# Patient Record
Sex: Female | Born: 2000 | Hispanic: No | Marital: Married | Smoking: Never smoker
Health system: Southern US, Community
[De-identification: ages and names within clinical notes are randomized; demographics above are authoritative.]

## PROBLEM LIST (undated history)

## (undated) DIAGNOSIS — Z789 Other specified health status: Secondary | ICD-10-CM

## (undated) HISTORY — PX: APPENDECTOMY: SHX54

---
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
## 2020-05-05 ENCOUNTER — Encounter (HOSPITAL_COMMUNITY): Payer: Self-pay

## 2020-05-05 ENCOUNTER — Encounter (HOSPITAL_COMMUNITY): Payer: Self-pay | Admitting: Family Medicine

## 2020-05-05 ENCOUNTER — Other Ambulatory Visit: Payer: Self-pay

## 2020-05-05 ENCOUNTER — Ambulatory Visit (HOSPITAL_COMMUNITY)
Admission: EM | Admit: 2020-05-05 | Discharge: 2020-05-05 | Disposition: A | Discharge status: Home / Self Care | Payer: Medicaid Other | Attending: Emergency Medicine | Admitting: Emergency Medicine

## 2020-05-05 ENCOUNTER — Inpatient Hospital Stay (HOSPITAL_COMMUNITY)
Admission: AD | Admit: 2020-05-05 | Discharge: 2020-05-05 | Disposition: A | Discharge status: Home / Self Care | Payer: Medicaid Other | Attending: Family Medicine | Admitting: Family Medicine

## 2020-05-05 ENCOUNTER — Inpatient Hospital Stay (HOSPITAL_COMMUNITY): Payer: Medicaid Other

## 2020-05-05 DIAGNOSIS — R109 Unspecified abdominal pain: Secondary | ICD-10-CM | POA: Insufficient documentation

## 2020-05-05 DIAGNOSIS — O26899 Other specified pregnancy related conditions, unspecified trimester: Secondary | ICD-10-CM

## 2020-05-05 DIAGNOSIS — O3680X0 Pregnancy with inconclusive fetal viability, not applicable or unspecified: Secondary | ICD-10-CM | POA: Diagnosis not present

## 2020-05-05 DIAGNOSIS — Z3A01 Less than 8 weeks gestation of pregnancy: Secondary | ICD-10-CM | POA: Insufficient documentation

## 2020-05-05 DIAGNOSIS — R1030 Lower abdominal pain, unspecified: Secondary | ICD-10-CM

## 2020-05-05 DIAGNOSIS — Z791 Long term (current) use of non-steroidal anti-inflammatories (NSAID): Secondary | ICD-10-CM | POA: Insufficient documentation

## 2020-05-05 DIAGNOSIS — O26891 Other specified pregnancy related conditions, first trimester: Secondary | ICD-10-CM | POA: Insufficient documentation

## 2020-05-05 DIAGNOSIS — Z3201 Encounter for pregnancy test, result positive: Secondary | ICD-10-CM

## 2020-05-05 HISTORY — DX: Other specified health status: Z78.9

## 2020-05-05 LAB — TYPE AND SCREEN
ABO/RH(D): O POS
Antibody Screen: NEGATIVE

## 2020-05-05 LAB — URINALYSIS, ROUTINE W REFLEX MICROSCOPIC
Bilirubin Urine: NEGATIVE
Glucose, UA: NEGATIVE mg/dL
Hgb urine dipstick: NEGATIVE
Ketones, ur: NEGATIVE mg/dL
Nitrite: NEGATIVE
Protein, ur: 30 mg/dL — AB
Specific Gravity, Urine: 1.028 (ref 1.005–1.030)
pH: 5 (ref 5.0–8.0)

## 2020-05-05 LAB — COMPREHENSIVE METABOLIC PANEL
ALT: 14 U/L (ref 0–44)
AST: 15 U/L (ref 15–41)
Albumin: 4.1 g/dL (ref 3.5–5.0)
Alkaline Phosphatase: 36 U/L — ABNORMAL LOW (ref 38–126)
Anion gap: 10 (ref 5–15)
BUN: 6 mg/dL (ref 6–20)
CO2: 22 mmol/L (ref 22–32)
Calcium: 9.4 mg/dL (ref 8.9–10.3)
Chloride: 105 mmol/L (ref 98–111)
Creatinine, Ser: 0.73 mg/dL (ref 0.44–1.00)
GFR, Estimated: >60 mL/min (ref >60)
Glucose, Bld: 101 mg/dL — ABNORMAL HIGH (ref 70–99)
Potassium: 3.7 mmol/L (ref 3.5–5.1)
Sodium: 137 mmol/L (ref 135–145)
Total Bilirubin: 0.5 mg/dL (ref 0.3–1.2)
Total Protein: 7.7 g/dL (ref 6.5–8.1)

## 2020-05-05 LAB — POCT URINALYSIS DIPSTICK, ED / UC
Bilirubin Urine: NEGATIVE
Glucose, UA: NEGATIVE mg/dL
Hgb urine dipstick: NEGATIVE
Ketones, ur: NEGATIVE mg/dL
Nitrite: NEGATIVE
Protein, ur: NEGATIVE mg/dL
Specific Gravity, Urine: 1.02 (ref 1.005–1.030)
Urobilinogen, UA: 0.2 mg/dL (ref 0.0–1.0)
pH: 6 (ref 5.0–8.0)

## 2020-05-05 LAB — CBC
HCT: 39.1 % (ref 36.0–46.0)
Hemoglobin: 12.7 g/dL (ref 12.0–15.0)
MCH: 29.1 pg (ref 26.0–34.0)
MCHC: 32.5 g/dL (ref 30.0–36.0)
MCV: 89.7 fL (ref 80.0–100.0)
Platelets: 335 10*3/uL (ref 150–400)
RBC: 4.36 MIL/uL (ref 3.87–5.11)
RDW: 12.9 % (ref 11.5–15.5)
WBC: 7.6 10*3/uL (ref 4.0–10.5)
nRBC: 0 % (ref 0.0–0.2)

## 2020-05-05 LAB — WET PREP, GENITAL
Sperm: NONE SEEN
Trich, Wet Prep: NONE SEEN
Yeast Wet Prep HPF POC: NONE SEEN

## 2020-05-05 LAB — HCG, QUANTITATIVE, PREGNANCY: hCG, Beta Chain, Quant, S: 367 m[IU]/mL — ABNORMAL HIGH (ref <5)

## 2020-05-05 LAB — POC URINE PREG, ED: Preg Test, Ur: POSITIVE — AB

## 2020-05-05 MED ORDER — ACETAMINOPHEN 325 MG PO TABS
650.0000 mg | ORAL_TABLET | Freq: Once | ORAL | Status: AC
  Administered 2020-05-05: 650 mg via ORAL

## 2020-05-05 MED ORDER — ACETAMINOPHEN 325 MG PO TABS
ORAL_TABLET | ORAL | Status: AC
  Filled 2020-05-05: qty 2

## 2020-05-05 NOTE — Progress Notes (Signed)
GC/Chlamydia & wet prep cultures obtained

## 2020-05-05 NOTE — MAU Provider Note (Signed)
History     CSN: [id number]  Arrival date and time: 05/05/20 1400   Event Date/Time   First Provider Initiated Contact with Patient 05/05/20 1516     *Video [place] interpreter used for this encounter*  Chief Complaint  Patient presents with  . Abdominal Pain   HPI Doris Stephens is a 20 y.o. G1P0 at [redacted]w[redacted]d who presents with abdominal pain. Reports lower abdominal cramping since last week. Pain radiates to low back. Rates pain 6/10. Nothing makes pain worse. Was given tylenol at urgent care today which has helped some. Denies n/v/d, fever/chills, dysuria, vaginal bleeding, or vaginal discharge.   OB History    Gravida  1   Para      Term      Preterm      AB      Living        SAB      IAB      Ectopic      Multiple      Live Births              Past Medical History:  Diagnosis Date  . Medical history non-contributory     Past Surgical History:  Procedure Laterality Date  . APPENDECTOMY      Family History  Problem Relation Age of Onset  . Healthy Mother   . Healthy Father     Social History   Tobacco Use  . Smoking status: Never Smoker  . Smokeless tobacco: Never Used  Vaping Use  . Vaping Use: Never used  Substance Use Topics  . Alcohol use: Never  . Drug use: Never    Allergies: No Known Allergies  Medications Prior to Admission  Medication Sig Dispense Refill Last Dose  . ibuprofen (ADVIL) 800 MG tablet Take 1 tablet (800 mg total) by mouth 3 (three) times daily. 21 tablet 0     Review of Systems  Constitutional: Negative.   Gastrointestinal: Positive for abdominal pain.  Genitourinary: Negative.    Physical Exam   Blood pressure 137/82, pulse 74, temperature 98.7 F (37.1 C), temperature source Oral, resp. rate 18, weight 72.6 kg, last menstrual period 04/10/2020, SpO2 100 %.  Physical Exam Vitals and nursing note reviewed. Exam conducted with a chaperone present.  Constitutional:      General: She is not in acute  distress.    Appearance: She is well-developed.  HENT:     Head: Normocephalic and atraumatic.  Pulmonary:     Effort: Pulmonary effort is normal. No respiratory distress.  Abdominal:     General: Abdomen is flat. There is no distension.     Palpations: Abdomen is soft.     Tenderness: There is no abdominal tenderness.  Genitourinary:    General: Normal vulva.     Exam position: Lithotomy position.  Neurological:     Mental Status: She is alert.     MAU Course  Procedures Results for orders placed or performed during the hospital encounter of 05/05/20 (from the past 24 hour(s))  CBC     Status: None   Collection Time: 05/05/20  3:03 PM  Result Value Ref Range   WBC 7.6 4.0 - 10.5 K/uL   RBC 4.36 3.87 - 5.11 MIL/uL   Hemoglobin 12.7 12.0 - 15.0 g/dL   HCT 56.3 87.5 - 64.3 %   MCV 89.7 80.0 - 100.0 fL   MCH 29.1 26.0 - 34.0 pg   MCHC 32.5 30.0 - 36.0 g/dL   RDW 12.9  11.5 - 15.5 %   Platelets 335 150 - 400 K/uL   nRBC 0.0 0.0 - 0.2 %  Comprehensive metabolic panel     Status: Abnormal   Collection Time: 05/05/20  3:03 PM  Result Value Ref Range   Sodium 137 135 - 145 mmol/L   Potassium 3.7 3.5 - 5.1 mmol/L   Chloride 105 98 - 111 mmol/L   CO2 22 22 - 32 mmol/L   Glucose, Bld 101 (H) 70 - 99 mg/dL   BUN 6 6 - 20 mg/dL   Creatinine, Ser 5.17 0.44 - 1.00 mg/dL   Calcium 9.4 8.9 - 61.6 mg/dL   Total Protein 7.7 6.5 - 8.1 g/dL   Albumin 4.1 3.5 - 5.0 g/dL   AST 15 15 - 41 U/L   ALT 14 0 - 44 U/L   Alkaline Phosphatase 36 (L) 38 - 126 U/L   Total Bilirubin 0.5 0.3 - 1.2 mg/dL   GFR, Estimated >07 >37 mL/min   Anion gap 10 5 - 15  Type and screen  [facility]     Status: None   Collection Time: 05/05/20  3:03 PM  Result Value Ref Range   ABO/RH(D) O POS    Antibody Screen NEG    Sample Expiration      05/08/2020,2359 Performed at [facility], [address] [place], [place] [address]   hCG, quantitative, pregnancy     Status: Abnormal    Collection Time: 05/05/20  3:03 PM  Result Value Ref Range   hCG, Beta Chain, Quant, S 367 (H) <5 mIU/mL  Urinalysis, Routine w reflex microscopic Urine, Clean Catch     Status: Abnormal   Collection Time: 05/05/20  3:25 PM  Result Value Ref Range   Color, Urine YELLOW YELLOW   APPearance HAZY (A) CLEAR   Specific Gravity, Urine 1.028 1.005 - 1.030   pH 5.0 5.0 - 8.0   Glucose, UA NEGATIVE NEGATIVE mg/dL   Hgb urine dipstick NEGATIVE NEGATIVE   Bilirubin Urine NEGATIVE NEGATIVE   Ketones, ur NEGATIVE NEGATIVE mg/dL   Protein, ur 30 (A) NEGATIVE mg/dL   Nitrite NEGATIVE NEGATIVE   Leukocytes,Ua TRACE (A) NEGATIVE   RBC / HPF 0-5 0 - 5 RBC/hpf   WBC, UA 11-20 0 - 5 WBC/hpf   Bacteria, UA RARE (A) NONE SEEN   Squamous Epithelial / LPF 11-20 0 - 5   Mucus PRESENT    Hyaline Casts, UA PRESENT   Wet prep, genital     Status: Abnormal   Collection Time: 05/05/20  3:25 PM   Specimen: PATH Cytology Cervicovaginal Ancillary Only  Result Value Ref Range   Yeast Wet Prep HPF POC NONE SEEN NONE SEEN   Trich, Wet Prep NONE SEEN NONE SEEN   Clue Cells Wet Prep HPF POC PRESENT (A) NONE SEEN   WBC, Wet Prep HPF POC MANY (A) NONE SEEN   Sperm NONE SEEN    US OB LESS THAN 14 WEEKS WITH OB TRANSVAGINAL  Result Date: 05/05/2020 CLINICAL DATA:  Pain EXAM: OBSTETRIC <14 WK [place] AND TRANSVAGINAL OB US TECHNIQUE: Both transabdominal and transvaginal ultrasound examinations were performed for complete evaluation of the gestation as well as the maternal uterus, adnexal regions, and pelvic cul-de-sac. Transvaginal technique was performed to assess early pregnancy. COMPARISON:  None. FINDINGS: Intrauterine gestational sac: None Yolk sac:  Not Visualized. Embryo:  Not Visualized. Cardiac Activity: Not Visualized. Maternal uterus/adnexae: Anteverted uterus. Thickened endometrium up to 24 mm in  double stripe thickness could reflect decidualization. No visible gestational sac or fluid collection is seen. Corpus  luteum noted in the left ovary. Ovaries are otherwise unremarkable. Trace volume of anechoic free fluid in pelvis is nonspecific. IMPRESSION: No intrauterine pregnancy visualized. Corpus luteum in the left ovary and decidualization of the endometrium is seen. Differential considerations would include early intrauterine pregnancy too early to visualize, spontaneous abortion, or occult ectopic pregnancy. Recommend close clinical followup and serial quantitative beta HCGs and ultrasounds. Electronically Signed   By: Kreg Shropshire M.D.   On: 05/05/2020 16:02    MDM +UPT UA, wet prep, GC/chlamydia, CBC, ABO/Rh, quant hCG, and [place] today to rule out ectopic pregnancy which can be life threatening.   Ultrasound shows no IUP or adnexal mass. HCG today is 367. This abdominal cramping could represent a normal pregnancy, spontaneous abortion, or even an ectopic pregnancy which can be life-threatening. Cultures were obtained to rule out pelvic infection.    Assessment and Plan   1. Pregnancy, location unknown   2. Abdominal pain affecting pregnancy    -return to MAU on Saturday for repeat HCG -reviewed ectopic vs SAB precautions  [name] 05/05/2020, 5:17 PM

## 2020-05-05 NOTE — Discharge Instructions (Addendum)
Please report to the Maternal Admission Unit for an evaluation of your belly pain with positive pregnancy test.

## 2020-05-05 NOTE — Discharge Instructions (Signed)
Return to care  If you have heavier bleeding that soaks through more that 2 pads per hour for an hour or more If you bleed so much that you feel like you might pass out or you do pass out If you have significant abdominal pain that is not improved with Tylenol   

## 2020-05-05 NOTE — MAU Note (Signed)
Pt sent from urgent care with lower abd pain and back pain for a few days. Had some spotting earlier today.

## 2020-05-05 NOTE — ED Provider Notes (Signed)
[facility]   MRN: [id number] DOB: June 29, 2000  Subjective:   Doris Stephens is a 20 y.o. female presenting for 1 week history of persistent lower abdominal pain, cramping type sensations. She is also having mild intermittent low back pain. LMP was last month on the 16th. She is sexually active with her husband only, does not use condoms for protection. Denies fever, nausea, vomiting, vaginal discharge, dysuria, urinary frequency.  No current facility-administered medications for this encounter.  Current Outpatient Medications:  .  ibuprofen (ADVIL) 800 MG tablet, Take 1 tablet (800 mg total) by mouth 3 (three) times daily., Disp: 21 tablet, Rfl: 0   No Known Allergies  History reviewed. No pertinent past medical history.   History reviewed. No pertinent surgical history.  Family History  Problem Relation Age of Onset  . Healthy Mother   . Healthy Father     Social History   Tobacco Use  . Smoking status: Never Smoker  . Smokeless tobacco: Never Used  Substance Use Topics  . Alcohol use: Never  . Drug use: Never    ROS   Objective:   Vitals: BP (!) 149/83 (BP Location: Right Arm)   Pulse 95   Temp 98.4 F (36.9 C) (Oral)   Resp 16   LMP  (Within Months) Comment: 1 month  SpO2 100%   Physical Exam Constitutional:      General: She is not in acute distress.    Appearance: Normal appearance. She is well-developed and normal weight. She is not ill-appearing, toxic-appearing or diaphoretic.  HENT:     Head: Normocephalic and atraumatic.     Right Ear: External ear normal.     Left Ear: External ear normal.     Nose: Nose normal.     Mouth/Throat:     Mouth: Mucous membranes are moist.     Pharynx: Oropharynx is clear.  Eyes:     General: No scleral icterus.    Extraocular Movements: Extraocular movements intact.     Pupils: Pupils are equal, round, and reactive to light.  Cardiovascular:     Rate and Rhythm: Normal rate and regular rhythm.      Heart sounds: Normal heart sounds. No murmur heard. No friction rub. No gallop.   Pulmonary:     Effort: Pulmonary effort is normal. No respiratory distress.     Breath sounds: Normal breath sounds. No stridor. No wheezing, rhonchi or rales.  Abdominal:     General: Bowel sounds are normal. There is no distension.     Palpations: Abdomen is soft. There is no mass.     Tenderness: There is abdominal tenderness in the right lower quadrant, periumbilical area, suprapubic area and left lower quadrant. There is no right CVA tenderness, left CVA tenderness, guarding or rebound.  Skin:    General: Skin is warm and dry.     Coloration: Skin is not pale.     Findings: No rash.  Neurological:     General: No focal deficit present.     Mental Status: She is alert and oriented to person, place, and time.  Psychiatric:        Mood and Affect: Mood normal.        Behavior: Behavior normal.        Thought Content: Thought content normal.        Judgment: Judgment normal.     Results for orders placed or performed during the hospital encounter of 05/05/20 (from the past 24  hour(s))  POCT Urinalysis Dipstick (ED/UC)     Status: Abnormal   Collection Time: 05/05/20 12:36 PM  Result Value Ref Range   Glucose, UA NEGATIVE NEGATIVE mg/dL   Bilirubin Urine NEGATIVE NEGATIVE   Ketones, ur NEGATIVE NEGATIVE mg/dL   Specific Gravity, Urine 1.020 1.005 - 1.030   Hgb urine dipstick NEGATIVE NEGATIVE   pH 6.0 5.0 - 8.0   Protein, ur NEGATIVE NEGATIVE mg/dL   Urobilinogen, UA 0.2 0.0 - 1.0 mg/dL   Nitrite NEGATIVE NEGATIVE   Leukocytes,Ua SMALL (A) NEGATIVE  POC urine preg, ED (not at [facility])     Status: Abnormal   Collection Time: 05/05/20 12:40 PM  Result Value Ref Range   Preg Test, Ur POSITIVE (A) NEGATIVE    Assessment and Plan :   PDMP not reviewed this encounter.  1. Positive pregnancy test   2. Lower abdominal pain     Patient given Tylenol in clinic to help with her lower abdominal  pain. Discussed her positive pregnancy test and together with her abdominal pain both by report and on exam recommended evaluation in the maternal admission unit for rule out of an ectopic pregnancy versus other acute intra-abdominal process related to her pregnancy. Patient is hemodynamically stable, will report to the MAU now.   Wallis Bamberg, PA-C 05/05/20 4403

## 2020-05-05 NOTE — ED Triage Notes (Signed)
Pt presents with lower  abdominal pain and back pain x 1 week. Denies fever, dysuria,vaginal discharge, diarrhea.

## 2020-05-06 LAB — GC/CHLAMYDIA PROBE AMP (~~LOC~~) NOT AT ARMC
Chlamydia: NEGATIVE
Comment: NEGATIVE
Comment: NORMAL
Neisseria Gonorrhea: NEGATIVE

## 2020-05-07 ENCOUNTER — Inpatient Hospital Stay (HOSPITAL_COMMUNITY)
Admission: AD | Admit: 2020-05-07 | Discharge: 2020-05-07 | Disposition: A | Discharge status: Home / Self Care | Payer: Medicaid Other | Attending: Obstetrics and Gynecology | Admitting: Obstetrics and Gynecology

## 2020-05-07 ENCOUNTER — Other Ambulatory Visit: Payer: Self-pay

## 2020-05-07 DIAGNOSIS — Z3A01 Less than 8 weeks gestation of pregnancy: Secondary | ICD-10-CM | POA: Diagnosis not present

## 2020-05-07 DIAGNOSIS — Z3A00 Weeks of gestation of pregnancy not specified: Secondary | ICD-10-CM

## 2020-05-07 DIAGNOSIS — R109 Unspecified abdominal pain: Secondary | ICD-10-CM | POA: Insufficient documentation

## 2020-05-07 DIAGNOSIS — O3680X0 Pregnancy with inconclusive fetal viability, not applicable or unspecified: Secondary | ICD-10-CM | POA: Insufficient documentation

## 2020-05-07 LAB — URINE CULTURE: Culture: >=100000 — AB

## 2020-05-07 LAB — HCG, QUANTITATIVE, PREGNANCY: hCG, Beta Chain, Quant, S: 903 m[IU]/mL — ABNORMAL HIGH (ref <5)

## 2020-05-07 NOTE — Discharge Instructions (Signed)
Human Chorionic Gonadotropin Test Why am I having this test? A human chorionic gonadotropin (hCG) test is done to determine whether you are pregnant. It can also be used:  To diagnose an abnormal pregnancy.  To determine whether you have had a miscarriage or are at risk of one. What is being tested? This test checks the level of the human chorionic gonadotropin (hCG) hormone in the blood. This hormone is produced during pregnancy by the cells that form the placenta. The placenta is the organ that grows inside your uterus to nourish a developing baby. When you are pregnant, hCG can be detected in your blood or urine 7 to 8 days before your missed period. The amount of hCG continues to increase for the first 8-10 weeks of pregnancy. The presence of hCG in your blood can be measured with different types of tests. You may have:  A urine test. ? A urine test only shows whether there is hCG in your urine. It does not measure how much.  A qualitative blood test. ? This blood test only shows whether there is hCG in your blood. It does not measure how much.  A quantitative blood test. ? This type of blood test measures the amount of hCG in your blood. ? You may have this test to:  Diagnose an abnormal pregnancy.  Check whether you have had a miscarriage.  Determine whether you are at risk of a miscarriage.  Determine if treatment of an ectopic pregnancy is successful. What kind of sample is taken? Two kinds of samples may be collected to test for the hCG hormone.  Blood. It is usually collected by inserting a needle into a blood vessel.  Urine. It is usually collected by urinating into a germ-free (sterile) specimen cup.      How do I prepare for this test? No preparation is needed for a blood test.  Some preparation is needed for a urine test:  For best results, collect the sample the first time you urinate in the morning. That is when the concentration of hCG is highest.  Do not  drink too much fluid. Drink as you normally would, or as directed by your health care provider. Tell a health care provider about:  All medicines you are taking, including vitamins, herbs, eye drops, creams, and over-the-counter medicines.  Any blood in your urine. This may interfere with the result. How are the results reported? Depending on the type of test that you have, your test results may be reported as values. Your health care provider will compare your results to normal ranges that were established after testing a large group of people (reference ranges). Reference ranges may vary among labs and hospitals. For this test, common reference ranges that show absence of pregnancy are:  Quantitative hCG blood levels: less than 5 IU/L. Other results will be reported as either positive or negative. For this test, normal results (meaning the absence of pregnancy) are:  Negative for hCG in the urine test.  Negative for hCG in the qualitative blood test. What do the results mean? Urine and qualitative blood test  A negative result could mean: ? That you are not pregnant. ? That the test was done too early in your pregnancy to detect hCG in your blood or urine. If you still have other signs of pregnancy, the test will be repeated.  A positive result means: ? That you are most likely pregnant. Your health care provider may confirm your pregnancy with an ultrasound of   your uterus, if needed. Quantitative blood test Results of the quantitative hCG blood test will be reported as values. These values will be interpreted by your health care provider along with your medical history and symptoms you are experiencing. Results outside of expected ranges could mean that:  You are pregnant with twins.  You have abnormal growths in your uterus.  You have an ectopic pregnancy.  You may be experiencing a miscarriage. Talk with your health care provider about what your results mean. Questions to ask  your health care provider Ask your health care provider, or the department that is doing the test:  When will my results be ready?  How will I get my results?  What are my treatment options?  What other tests do I need?  What are my next steps? Summary  A human chorionic gonadotropin (hCG) test is done to determine whether you are pregnant.  When you are pregnant, hCG can be detected in your blood or urine 7 to 8 days before your missed period. HCG levels continue to go up for the first 8-10 weeks of pregnancy.  Your hCG level can be measured with different types of tests. You may have a urine test, a qualitative blood test, or a quantitative blood test.  Talk with your health care provider about what your test results mean. This information is not intended to replace advice given to you by your health care provider. Make sure you discuss any questions you have with your health care provider. Document Revised: 12/14/2019 Document Reviewed: 12/14/2019 Elsevier Patient Education  2021 [employer]  

## 2020-05-07 NOTE — MAU Provider Note (Signed)
Event Date/Time  First Provider Initiated Contact with Patient 05/07/20 1652     S [name] is a 20 y.o. G1P0 patient who presents to MAU today for repeat quant hCG. Patient denies vaginal bleeding, states her abdominal pain is "much better" than when she was evaluated in MAU two days ago.   O BP 128/86 (BP Location: Right Arm)   Pulse 100   Temp 99.8 F (37.7 C) (Oral)   Resp 18   LMP 04/10/2020   SpO2 100% Comment: room air   Physical Exam Vitals and nursing note reviewed. Exam conducted with a chaperone present.  Cardiovascular:     Rate and Rhythm: Normal rate.     Pulses: Normal pulses.  Pulmonary:     Effort: Pulmonary effort is normal.  Skin:    Capillary Refill: Capillary refill takes less than 2 seconds.  Neurological:     Mental Status: She is alert.  Psychiatric:        Mood and Affect: Mood normal.        Behavior: Behavior normal.        Thought Content: Thought content normal.        Judgment: Judgment normal.    A Medical screening exam complete Appropriate rise in Quant hCG (367 on 05/05/2020, now 903) Patient arrived with ten year old and no options for child care. She was discharged in stable condition with the understanding that she may need to return to MAU for additional assessment if Quant results were abnormal. Patient called at home at 1819. Reassuring result reviewed after confirming identity  P Discharge from MAU in stable condition with ectopic precautions  F/U: Orders placed for repeat ultrasound in 10-14 days. Pt knows to expect scheduling phone call   Briant Sites 05/07/2020 7:11 PM

## 2020-05-07 NOTE — MAU Note (Signed)
Doris Stephens is a 20 y.o. at [redacted]w[redacted]d here in MAU reporting: here for follow up hcg. Denies bleeding. Is having abdominal and back pain.   Onset of complaint: ongoing  Pain score: abdomen 6/10, back 7/10  Vitals:   05/07/20 1652  BP: 128/86  Pulse: 100  Resp: 18  Temp: 99.8 F (37.7 C)  SpO2: 100%     Lab orders placed from triage: hcg

## 2020-05-09 ENCOUNTER — Telehealth (HOSPITAL_COMMUNITY): Payer: Self-pay | Admitting: Emergency Medicine

## 2020-05-09 MED ORDER — CEPHALEXIN 500 MG PO CAPS: 500.0000 mg | ORAL_CAPSULE | Freq: Four times a day (QID) | ORAL | 0 refills | Status: AC

## 2020-06-18 ENCOUNTER — Encounter (HOSPITAL_COMMUNITY): Payer: Self-pay | Admitting: Obstetrics and Gynecology

## 2020-06-18 ENCOUNTER — Inpatient Hospital Stay (HOSPITAL_COMMUNITY): Payer: Medicaid Other

## 2020-06-18 ENCOUNTER — Inpatient Hospital Stay (HOSPITAL_COMMUNITY)
Admission: AD | Admit: 2020-06-18 | Discharge: 2020-06-18 | Disposition: A | Discharge status: Home / Self Care | Payer: Medicaid Other | Attending: Obstetrics and Gynecology | Admitting: Obstetrics and Gynecology

## 2020-06-18 ENCOUNTER — Other Ambulatory Visit: Payer: Self-pay

## 2020-06-18 DIAGNOSIS — R109 Unspecified abdominal pain: Secondary | ICD-10-CM | POA: Diagnosis not present

## 2020-06-18 DIAGNOSIS — M549 Dorsalgia, unspecified: Secondary | ICD-10-CM | POA: Insufficient documentation

## 2020-06-18 DIAGNOSIS — R102 Pelvic and perineal pain: Secondary | ICD-10-CM | POA: Insufficient documentation

## 2020-06-18 DIAGNOSIS — O26891 Other specified pregnancy related conditions, first trimester: Secondary | ICD-10-CM | POA: Diagnosis not present

## 2020-06-18 DIAGNOSIS — Z3491 Encounter for supervision of normal pregnancy, unspecified, first trimester: Secondary | ICD-10-CM

## 2020-06-18 DIAGNOSIS — Z3A01 Less than 8 weeks gestation of pregnancy: Secondary | ICD-10-CM | POA: Diagnosis not present

## 2020-06-18 DIAGNOSIS — O99891 Other specified diseases and conditions complicating pregnancy: Secondary | ICD-10-CM | POA: Diagnosis not present

## 2020-06-18 DIAGNOSIS — Z3A09 9 weeks gestation of pregnancy: Secondary | ICD-10-CM

## 2020-06-18 LAB — URINALYSIS, ROUTINE W REFLEX MICROSCOPIC
Bilirubin Urine: NEGATIVE
Glucose, UA: NEGATIVE mg/dL
Hgb urine dipstick: NEGATIVE
Ketones, ur: NEGATIVE mg/dL
Leukocytes,Ua: NEGATIVE
Nitrite: NEGATIVE
Protein, ur: NEGATIVE mg/dL
Specific Gravity, Urine: 1.016 (ref 1.005–1.030)
pH: 8 (ref 5.0–8.0)

## 2020-06-18 MED ORDER — ACETAMINOPHEN 500 MG PO TABS
1000.0000 mg | ORAL_TABLET | Freq: Once | ORAL | Status: AC
  Administered 2020-06-18: 1000 mg via ORAL
  Filled 2020-06-18: qty 2

## 2020-06-18 NOTE — Discharge Instructions (Signed)
Back Pain in Pregnancy Back pain during pregnancy is common. Back pain may be caused by several factors that are related to changes during your pregnancy. Follow these instructions at home: Managing pain, stiffness, and swelling  If directed, for sudden (acute) back pain, put ice on the painful area. ? Put ice in a plastic bag. ? Place a towel between your skin and the bag. ? Leave the ice on for 20 minutes, 2-3 times per day.  If directed, apply heat to the affected area before you exercise. Use the heat source that your health care provider recommends, such as a moist heat pack or a heating pad. ? Place a towel between your skin and the heat source. ? Leave the heat on for 20-30 minutes. ? Remove the heat if your skin turns bright red. This is especially important if you are unable to feel pain, heat, or cold. You may have a greater risk of getting burned.  If directed, massage the affected area.      Activity  Exercise as told by your health care provider. Gentle exercise is the best way to prevent or manage back pain.  Listen to your body when lifting. If lifting hurts, ask for help or bend your knees. This uses your leg muscles instead of your back muscles.  Squat down when picking up something from the floor. Do not bend over.  Only use bed rest for short periods as told by your health care provider. Bed rest should only be used for the most severe episodes of back pain. Standing, sitting, and lying down  Do not stand in one place for long periods of time.  Use good posture when sitting. Make sure your head rests over your shoulders and is not hanging forward. Use a pillow on your lower back if necessary.  Try sleeping on your side, preferably the left side, with a pregnancy support pillow or 1-2 regular pillows between your legs. ? If you have back pain after a night's rest, your bed may be too soft. ? A firm mattress may provide more support for your back during  pregnancy. General instructions  Do not wear high heels.  Eat a healthy diet. Try to gain weight within your health care provider's recommendations.  Use a maternity girdle, elastic sling, or back brace as told by your health care provider.  Take over-the-counter and prescription medicines only as told by your health care provider.  Work with a physical therapist or massage therapist to find ways to manage back pain. Acupuncture or massage therapy may be helpful.  Keep all follow-up visits as told by your health care provider. This is important. Contact a health care provider if:  Your back pain interferes with your daily activities.  You have increasing pain in other parts of your body. Get help right away if:  You develop numbness, tingling, weakness, or problems with the use of your arms or legs.  You develop severe back pain that is not controlled with medicine.  You have a change in bowel or bladder control.  You develop shortness of breath, dizziness, or you faint.  You develop nausea, vomiting, or sweating.  You have back pain that is a rhythmic, cramping pain similar to labor pains. Labor pain is usually 1-2 minutes apart, lasts for about 1 minute, and involves a bearing down feeling or pressure in your pelvis.  You have back pain and your water breaks or you have vaginal bleeding.  You have back pain or   numbness that travels down your leg.  Your back pain developed after you fell.  You develop pain on one side of your back.  You see blood in your urine.  You develop skin blisters in the area of your back pain. Summary  Back pain may be caused by several factors that are related to changes during your pregnancy.  Follow instructions as told by your health care provider for managing pain, stiffness, and swelling.  Exercise as told by your health care provider. Gentle exercise is the best way to prevent or manage back pain.  Take over-the-counter and  prescription medicines only as told by your health care provider.  Keep all follow-up visits as told by your health care provider. This is important. This information is not intended to replace advice given to you by your health care provider. Make sure you discuss any questions you have with your health care provider. Document Revised: 07/01/2018 Document Reviewed: 08/28/2017 Elsevier Patient Education  2021 [employer]  

## 2020-06-18 NOTE — MAU Provider Note (Signed)
History     CSN: [id number]  Arrival date and time: 06/18/20 1447   Event Date/Time   First Provider Initiated Contact with Patient 06/18/20 1527      *phone [place] interpreter used for this encounter*   Chief Complaint  Patient presents with  . Back Pain   HPI Doris Stephens is a 20 y.o. G1P0 at [redacted]w[redacted]d who presents with back pain. Symptoms started when she found out she was pregnant over a month ago. Pain is throughout lower back. Was intermittent but has more recently become constant. Rates pain 8/10. Hasn't treated pain. Nothing makes it better or worse. Denies fever/chills, dysuria, hematuria, vaginal bleeding, n/v/d, or vaginal discharge. Has not started prenatal care.   OB History    Gravida  1   Para      Term      Preterm      AB      Living        SAB      IAB      Ectopic      Multiple      Live Births              Past Medical History:  Diagnosis Date  . Medical history non-contributory     Past Surgical History:  Procedure Laterality Date  . APPENDECTOMY      Family History  Problem Relation Age of Onset  . Healthy Mother   . Healthy Father     Social History   Tobacco Use  . Smoking status: Never Smoker  . Smokeless tobacco: Never Used  Vaping Use  . Vaping Use: Never used  Substance Use Topics  . Alcohol use: Never  . Drug use: Never    Allergies: No Known Allergies  No medications prior to admission.    Review of Systems  Constitutional: Negative.   Gastrointestinal: Negative.   Genitourinary: Negative.   Musculoskeletal: Positive for back pain.   Physical Exam   Blood pressure 130/84, pulse 93, temperature 98.8 F (37.1 C), resp. rate 18, last menstrual period 04/10/2020.  Physical Exam Vitals and nursing note reviewed.  Constitutional:      General: She is not in acute distress.    Appearance: Normal appearance. She is normal weight.  HENT:     Head: Normocephalic and atraumatic.  Pulmonary:     Effort:  Pulmonary effort is normal. No respiratory distress.  Abdominal:     General: Abdomen is flat. There is no distension.     Palpations: Abdomen is soft.     Tenderness: There is no abdominal tenderness. There is no right CVA tenderness or left CVA tenderness.  Musculoskeletal:     Thoracic back: Normal.     Lumbar back: Normal.  Skin:    General: Skin is warm and dry.  Neurological:     Mental Status: She is alert.  Psychiatric:        Mood and Affect: Mood normal.        Behavior: Behavior normal.     MAU Course  Procedures Results for orders placed or performed during the hospital encounter of 06/18/20 (from the past 24 hour(s))  Urinalysis, Routine w reflex microscopic Urine, Clean Catch     Status: Abnormal   Collection Time: 06/18/20  3:11 PM  Result Value Ref Range   Color, Urine YELLOW YELLOW   APPearance HAZY (A) CLEAR   Specific Gravity, Urine 1.016 1.005 - 1.030   pH 8.0 5.0 - 8.0  Glucose, UA NEGATIVE NEGATIVE mg/dL   Hgb urine dipstick NEGATIVE NEGATIVE   Bilirubin Urine NEGATIVE NEGATIVE   Ketones, ur NEGATIVE NEGATIVE mg/dL   Protein, ur NEGATIVE NEGATIVE mg/dL   Nitrite NEGATIVE NEGATIVE   Leukocytes,Ua NEGATIVE NEGATIVE   US OB Comp Less 14 Wks  Result Date: 06/18/2020 CLINICAL DATA:  Back pain EXAM: OBSTETRIC <14 WK ULTRASOUND TECHNIQUE: Transabdominal ultrasound was performed for evaluation of the gestation as well as the maternal uterus and adnexal regions. COMPARISON:  05/05/2020 FINDINGS: Intrauterine gestational sac: Single Yolk sac:  Visualized. Embryo:  Visualized. Cardiac Activity: Visualized. Heart Rate: 164 bpm CRL: 39.8 mm   10 w 6 d                  [place] EDC: 01/08/2021 Subchorionic hemorrhage: Small subchorionic hemorrhage measuring up to 5 mm in thickness. Maternal uterus/adnexae: There are no adnexal masses. Left ovary measures 4.6 x 2.1 x 2.8 cm in the right ovary measures 3.3 x 1.4 x 1.1 cm. No free fluid. IMPRESSION: 1. Single live intrauterine  pregnancy as above estimated age 47 weeks and 6 days. 2. Small subchorionic hemorrhage. Electronically Signed   By: Sharlet Salina M.D.   On: 06/18/2020 16:50    MDM Previously seen in MAU for pregnancy of unknown location & didn't have follow up. Ultrasound today shows live IUP.   U/a negative  Benign exam  Tylenol & warm packs for back pain with some relief.   Assessment and Plan   1. Back pain affecting pregnancy in first trimester   2. Pelvic pain in pregnancy, antepartum, first trimester   3. Normal IUP (intrauterine pregnancy) on prenatal ultrasound, first trimester   4. [redacted] weeks gestation of pregnancy    -reviewed reasons to return to MAU -start prenatal care - message sent to [facility] to call patient for new ob appt due to language barrier  Doris Stephens 06/18/2020, 6:10 PM

## 2020-06-18 NOTE — MAU Note (Signed)
Pt reports she has had lower back pain since the begining of her pregnancy. Hurts worse whe she lays Came a few wedks ago but was not given anything for it.  Denies any vag bleeding or discharge.

## 2020-06-24 DIAGNOSIS — Z3400 Encounter for supervision of normal first pregnancy, unspecified trimester: Secondary | ICD-10-CM | POA: Insufficient documentation

## 2020-06-27 ENCOUNTER — Other Ambulatory Visit (HOSPITAL_COMMUNITY)
Admission: RE | Admit: 2020-06-27 | Discharge: 2020-06-27 | Disposition: A | Discharge status: Home / Self Care | Payer: Medicaid Other | Source: Ambulatory Visit | Attending: Obstetrics and Gynecology | Admitting: Obstetrics and Gynecology

## 2020-06-27 ENCOUNTER — Ambulatory Visit (INDEPENDENT_AMBULATORY_CARE_PROVIDER_SITE_OTHER): Payer: Medicaid Other | Admitting: Obstetrics and Gynecology

## 2020-06-27 ENCOUNTER — Encounter: Payer: Self-pay | Admitting: Obstetrics and Gynecology

## 2020-06-27 ENCOUNTER — Other Ambulatory Visit: Payer: Self-pay

## 2020-06-27 DIAGNOSIS — Z3401 Encounter for supervision of normal first pregnancy, first trimester: Secondary | ICD-10-CM

## 2020-06-27 DIAGNOSIS — Z3A12 12 weeks gestation of pregnancy: Secondary | ICD-10-CM

## 2020-06-27 DIAGNOSIS — Z3400 Encounter for supervision of normal first pregnancy, unspecified trimester: Secondary | ICD-10-CM | POA: Diagnosis present

## 2020-06-27 MED ORDER — PREPLUS 27-1 MG PO TABS: 1.0000 | ORAL_TABLET | Freq: Every day | ORAL | 13 refills | Status: AC

## 2020-06-27 NOTE — Patient Instructions (Signed)
 Obstetrics: Normal and Problem Pregnancies (7th ed., pp. 102-121). [place], [place]: [employer]."> Textbook of Family Medicine (9th ed., pp. 365-410). [place], [place]: [employer].">  First Trimester of Pregnancy  The first trimester of pregnancy starts on the first day of your last menstrual period until the end of week 12. This is months 1 through 3 of pregnancy. A week after a sperm fertilizes an egg, the egg will implant into the wall of the uterus and begin to develop into a baby. By the end of 12 weeks, all the baby's organs will be formed and the baby will be 2-3 inches in size. Body changes during your first trimester Your body goes through many changes during pregnancy. The changes vary and generally return to normal after your baby is born. Physical changes  You may gain or lose weight.  Your breasts may begin to grow larger and become tender. The tissue that surrounds your nipples (areola) may become darker.  Dark spots or blotches (chloasma or mask of pregnancy) may develop on your face.  You may have changes in your hair. These can include thickening or thinning of your hair or changes in texture. Health changes  You may feel nauseous, and you may vomit.  You may have heartburn.  You may develop headaches.  You may develop constipation.  Your gums may bleed and may be sensitive to brushing and flossing. Other changes  You may tire easily.  You may urinate more often.  Your menstrual periods will stop.  You may have a loss of appetite.  You may develop cravings for certain kinds of food.  You may have changes in your emotions from day to day.  You may have more vivid and strange dreams. Follow these instructions at home: Medicines  Follow your health care provider's instructions regarding medicine use. Specific medicines may be either safe or unsafe to take during pregnancy. Do not take any medicines unless told to by your health care provider.  Take  a prenatal vitamin that contains at least 600 micrograms (mcg) of folic acid. Eating and drinking  Eat a healthy diet that includes fresh fruits and vegetables, whole grains, good sources of protein such as meat, eggs, or tofu, and low-fat dairy products.  Avoid raw meat and unpasteurized juice, milk, and cheese. These carry germs that can harm you and your baby.  If you feel nauseous or you vomit: ? Eat 4 or 5 small meals a day instead of 3 large meals. ? Try eating a few soda crackers. ? Drink liquids between meals instead of during meals.  You may need to take these actions to prevent or treat constipation: ? Drink enough fluid to keep your urine pale yellow. ? Eat foods that are high in fiber, such as beans, whole grains, and fresh fruits and vegetables. ? Limit foods that are high in fat and processed sugars, such as fried or sweet foods. Activity  Exercise only as directed by your health care provider. Most people can continue their usual exercise routine during pregnancy. Try to exercise for 30 minutes at least 5 days a week.  Stop exercising if you develop pain or cramping in the lower abdomen or lower back.  Avoid exercising if it is very hot or humid or if you are at high altitude.  Avoid heavy lifting.  If you choose to, you may have sex unless your health care provider tells you not to. Relieving pain and discomfort  Wear a good support bra to relieve   breast tenderness.  Rest with your legs elevated if you have leg cramps or low back pain.  If you develop bulging veins (varicose veins) in your legs: ? Wear support hose as told by your health care provider. ? Elevate your feet for 15 minutes, 3-4 times a day. ? Limit salt in your diet. Safety  Wear your seat belt at all times when driving or riding in a car.  Talk with your health care provider if someone is verbally or physically abusive to you.  Talk with your health care provider if you are feeling sad or have  thoughts of hurting yourself. Lifestyle  Do not use hot tubs, steam rooms, or saunas.  Do not douche. Do not use tampons or scented sanitary pads.  Do not use herbal remedies, alcohol, illegal drugs, or medicines that are not approved by your health care provider. Chemicals in these products can harm your baby.  Do not use any products that contain nicotine or tobacco, such as cigarettes, e-cigarettes, and chewing tobacco. If you need help quitting, ask your health care provider.  Avoid cat litter boxes and soil used by cats. These carry germs that can cause birth defects in the baby and possibly loss of the unborn baby (fetus) by miscarriage or stillbirth. General instructions  During routine prenatal visits in the first trimester, your health care provider will do a physical exam, perform necessary tests, and ask you how things are going. Keep all follow-up visits. This is important.  Ask for help if you have counseling or nutritional needs during pregnancy. Your health care provider can offer advice or refer you to specialists for help with various needs.  Schedule a dentist appointment. At home, brush your teeth with a soft toothbrush. Floss gently.  Write down your questions. Take them to your prenatal visits. Where to find more information  American Pregnancy Association: americanpregnancy.org  American College of Obstetricians and Gynecologists: acog.org/en/Womens%20Health/Pregnancy  Office on Women's Health: womenshealth.gov/pregnancy Contact a health care provider if you have:  Dizziness.  A fever.  Mild pelvic cramps, pelvic pressure, or nagging pain in the abdominal area.  Nausea, vomiting, or diarrhea that lasts for 24 hours or longer.  A bad-smelling vaginal discharge.  Pain when you urinate.  Known exposure to a contagious illness, such as chickenpox, measles, Zika virus, HIV, or hepatitis. Get help right away if you have:  Spotting or bleeding from your  vagina.  Severe abdominal cramping or pain.  Shortness of breath or chest pain.  Any kind of trauma, such as from a fall or a car crash.  New or increased pain, swelling, or redness in an arm or leg. Summary  The first trimester of pregnancy starts on the first day of your last menstrual period until the end of week 12 (months 1 through 3).  Eating 4 or 5 small meals a day rather than 3 large meals may help to relieve nausea and vomiting.  Do not use any products that contain nicotine or tobacco, such as cigarettes, e-cigarettes, and chewing tobacco. If you need help quitting, ask your health care provider.  Keep all follow-up visits. This is important. This information is not intended to replace advice given to you by your health care provider. Make sure you discuss any questions you have with your health care provider. Document Revised: 08/19/2019 Document Reviewed: 06/25/2019 Else[employer] Patient Education  2021 [employer]   Second Trimester of Pregnancy  The second trimester of pregnancy is from week 13 through week   27. This is months 4 through 6 of pregnancy. The second trimester is often a time when you feel your best. Your body has adjusted to being pregnant, and you begin to feel better physically. During the second trimester:  Morning sickness has lessened or stopped completely.  You may have more energy.  You may have an increase in appetite. The second trimester is also a time when the unborn baby (fetus) is growing rapidly. At the end of the sixth month, the fetus may be up to 12 inches long and weigh about 1 pounds. You will likely begin to feel the baby move (quickening) between 16 and 20 weeks of pregnancy. Body changes during your second trimester Your body continues to go through many changes during your second trimester. The changes vary and generally return to normal after the baby is born. Physical changes  Your weight will continue to increase. You will  notice your lower abdomen bulging out.  You may begin to get stretch marks on your hips, abdomen, and breasts.  Your breasts will continue to grow and to become tender.  Dark spots or blotches (chloasma or mask of pregnancy) may develop on your face.  A dark line from your belly button to the pubic area (linea nigra) may appear.  You may have changes in your hair. These can include thickening of your hair, rapid growth, and changes in texture. Some people also have hair loss during or after pregnancy, or hair that feels dry or thin. Health changes  You may develop headaches.  You may have heartburn.  You may develop constipation.  You may develop hemorrhoids or swollen, bulging veins (varicose veins).  Your gums may bleed and may be sensitive to brushing and flossing.  You may urinate more often because the fetus is pressing on your bladder.  You may have back pain. This is caused by: ? Weight gain. ? Pregnancy hormones that are relaxing the joints in your pelvis. ? A shift in weight and the muscles that support your balance. Follow these instructions at home: Medicines  Follow your health care provider's instructions regarding medicine use. Specific medicines may be either safe or unsafe to take during pregnancy. Do not take any medicines unless approved by your health care provider.  Take a prenatal vitamin that contains at least 600 micrograms (mcg) of folic acid. Eating and drinking  Eat a healthy diet that includes fresh fruits and vegetables, whole grains, good sources of protein such as meat, eggs, or tofu, and low-fat dairy products.  Avoid raw meat and unpasteurized juice, milk, and cheese. These carry germs that can harm you and your baby.  You may need to take these actions to prevent or treat constipation: ? Drink enough fluid to keep your urine pale yellow. ? Eat foods that are high in fiber, such as beans, whole grains, and fresh fruits and  vegetables. ? Limit foods that are high in fat and processed sugars, such as fried or sweet foods. Activity  Exercise only as directed by your health care provider. Most people can continue their usual exercise routine during pregnancy. Try to exercise for 30 minutes at least 5 days a week. Stop exercising if you develop contractions in your uterus.  Stop exercising if you develop pain or cramping in the lower abdomen or lower back.  Avoid exercising if it is very hot or humid or if you are at a high altitude.  Avoid heavy lifting.  If you choose to, you may have   sex unless your health care provider tells you not to. Relieving pain and discomfort  Wear a supportive bra to prevent discomfort from breast tenderness.  Take warm sitz baths to soothe any pain or discomfort caused by hemorrhoids. Use hemorrhoid cream if your health care provider approves.  Rest with your legs raised (elevated) if you have leg cramps or low back pain.  If you develop varicose veins: ? Wear support hose as told by your health care provider. ? Elevate your feet for 15 minutes, 3-4 times a day. ? Limit salt in your diet. Safety  Wear your seat belt at all times when driving or riding in a car.  Talk with your health care provider if someone is verbally or physically abusive to you. Lifestyle  Do not use hot tubs, steam rooms, or saunas.  Do not douche. Do not use tampons or scented sanitary pads.  Avoid cat litter boxes and soil used by cats. These carry germs that can cause birth defects in the baby and possibly loss of the fetus by miscarriage or stillbirth.  Do not use herbal remedies, alcohol, illegal drugs, or medicines that are not approved by your health care provider. Chemicals in these products can harm your baby.  Do not use any products that contain nicotine or tobacco, such as cigarettes, e-cigarettes, and chewing tobacco. If you need help quitting, ask your health care provider. General  instructions  During a routine prenatal visit, your health care provider will do a physical exam and other tests. He or she will also discuss your overall health. Keep all follow-up visits. This is important.  Ask your health care provider for a referral to a local prenatal education class.  Ask for help if you have counseling or nutritional needs during pregnancy. Your health care provider can offer advice or refer you to specialists for help with various needs. Where to find more information  American Pregnancy Association: americanpregnancy.org  American College of Obstetricians and Gynecologists: acog.org/en/Womens%20Health/Pregnancy  Office on Women's Health: womenshealth.gov/pregnancy Contact a health care provider if you have:  A headache that does not go away when you take medicine.  Vision changes or you see spots in front of your eyes.  Mild pelvic cramps, pelvic pressure, or nagging pain in the abdominal area.  Persistent nausea, vomiting, or diarrhea.  A bad-smelling vaginal discharge or foul-smelling urine.  Pain when you urinate.  Sudden or extreme swelling of your face, hands, ankles, feet, or legs.  A fever. Get help right away if you:  Have fluid leaking from your vagina.  Have spotting or bleeding from your vagina.  Have severe abdominal cramping or pain.  Have difficulty breathing.  Have chest pain.  Have fainting spells.  Have not felt your baby move for the time period told by your health care provider.  Have new or increased pain, swelling, or redness in an arm or leg. Summary  The second trimester of pregnancy is from week 13 through week 27 (months 4 through 6).  Do not use herbal remedies, alcohol, illegal drugs, or medicines that are not approved by your health care provider. Chemicals in these products can harm your baby.  Exercise only as directed by your health care provider. Most people can continue their usual exercise routine  during pregnancy.  Keep all follow-up visits. This is important. This information is not intended to replace advice given to you by your health care provider. Make sure you discuss any questions you have with your health care   provider. Document Revised: 08/19/2019 Document Reviewed: 06/25/2019 Else[employer] Patient Education  2021 [employer]   Contraception Choices Contraception, also called birth control, refers to methods or devices that prevent pregnancy. Hormonal methods Contraceptive implant A contraceptive implant is a thin, plastic tube that contains a hormone that prevents pregnancy. It is different from an intrauterine device (IUD). It is inserted into the upper part of the arm by a health care provider. Implants can be effective for up to 3 years. Progestin-only injections Progestin-only injections are injections of progestin, a synthetic form of the hormone progesterone. They are given every 3 months by a health care provider. Birth control pills Birth control pills are pills that contain hormones that prevent pregnancy. They must be taken once a day, preferably at the same time each day. A prescription is needed to use this method of contraception. Birth control patch The birth control patch contains hormones that prevent pregnancy. It is placed on the skin and must be changed once a week for three weeks and removed on the fourth week. A prescription is needed to use this method of contraception. Vaginal ring A vaginal ring contains hormones that prevent pregnancy. It is placed in the vagina for three weeks and removed on the fourth week. After that, the process is repeated with a new ring. A prescription is needed to use this method of contraception. Emergency contraceptive Emergency contraceptives prevent pregnancy after unprotected sex. They come in pill form and can be taken up to 5 days after sex. They work best the sooner they are taken after having sex. Most emergency  contraceptives are available without a prescription. This method should not be used as your only form of birth control.   Barrier methods Female condom A female condom is a thin sheath that is worn over the penis during sex. Condoms keep sperm from going inside a woman's body. They can be used with a sperm-killing substance (spermicide) to increase their effectiveness. They should be thrown away after one use. Female condom A female condom is a soft, loose-fitting sheath that is put into the vagina before sex. The condom keeps sperm from going inside a woman's body. They should be thrown away after one use. Diaphragm A diaphragm is a soft, dome-shaped barrier. It is inserted into the vagina before sex, along with a spermicide. The diaphragm blocks sperm from entering the uterus, and the spermicide kills sperm. A diaphragm should be left in the vagina for 6-8 hours after sex and removed within 24 hours. A diaphragm is prescribed and fitted by a health care provider. A diaphragm should be replaced every 1-2 years, after giving birth, after gaining more than 15 lb (6.8 kg), and after pelvic surgery. Cervical cap A cervical cap is a round, soft latex or plastic cup that fits over the cervix. It is inserted into the vagina before sex, along with spermicide. It blocks sperm from entering the uterus. The cap should be left in place for 6-8 hours after sex and removed within 48 hours. A cervical cap must be prescribed and fitted by a health care provider. It should be replaced every 2 years. Sponge A sponge is a soft, circular piece of polyurethane foam with spermicide in it. The sponge helps block sperm from entering the uterus, and the spermicide kills sperm. To use it, you make it wet and then insert it into the vagina. It should be inserted before sex, left in for at least 6 hours after sex, and removed and thrown away   within 30 hours. Spermicides Spermicides are chemicals that kill or block sperm from  entering the cervix and uterus. They can come as a cream, jelly, suppository, foam, or tablet. A spermicide should be inserted into the vagina with an applicator at least 10-15 minutes before sex to allow time for it to work. The process must be repeated every time you have sex. Spermicides do not require a prescription.   Intrauterine contraception Intrauterine device (IUD) An IUD is a T-shaped device that is put in a woman's uterus. There are two types:  Hormone IUD.This type contains progestin, a synthetic form of the hormone progesterone. This type can stay in place for 3-5 years.  Copper IUD.This type is wrapped in copper wire. It can stay in place for 10 years. Permanent methods of contraception Female tubal ligation In this method, a woman's fallopian tubes are sealed, tied, or blocked during surgery to prevent eggs from traveling to the uterus. Hysteroscopic sterilization In this method, a small, flexible insert is placed into each fallopian tube. The inserts cause scar tissue to form in the fallopian tubes and block them, so sperm cannot reach an egg. The procedure takes about 3 months to be effective. Another form of birth control must be used during those 3 months. Female sterilization This is a procedure to tie off the tubes that carry sperm (vasectomy). After the procedure, the man can still ejaculate fluid (semen). Another form of birth control must be used for 3 months after the procedure. Natural planning methods Natural family planning In this method, a couple does not have sex on days when the woman could become pregnant. Calendar method In this method, the woman keeps track of the length of each menstrual cycle, identifies the days when pregnancy can happen, and does not have sex on those days. Ovulation method In this method, a couple avoids sex during ovulation. Symptothermal method This method involves not having sex during ovulation. The woman typically checks for  ovulation by watching changes in her temperature and in the consistency of cervical mucus. Post-ovulation method In this method, a couple waits to have sex until after ovulation. Where to find more information  Centers for Disease Control and Prevention: www.cdc.gov Summary  Contraception, also called birth control, refers to methods or devices that prevent pregnancy.  Hormonal methods of contraception include implants, injections, pills, patches, vaginal rings, and emergency contraceptives.  Barrier methods of contraception can include female condoms, female condoms, diaphragms, cervical caps, sponges, and spermicides.  There are two types of IUDs (intrauterine devices). An IUD can be put in a woman's uterus to prevent pregnancy for 3-5 years.  Permanent sterilization can be done through a procedure for males and females. Natural family planning methods involve nothaving sex on days when the woman could become pregnant. This information is not intended to replace advice given to you by your health care provider. Make sure you discuss any questions you have with your health care provider. Document Revised: 08/17/2019 Document Reviewed: 08/17/2019 Elsevier Patient Education  2021 [employer].   Breastfeeding  Choosing to breastfeed is one of the best decisions you can make for yourself and your baby. A change in hormones during pregnancy causes your breasts to make breast milk in your milk-producing glands. Hormones prevent breast milk from being released before your baby is born. They also prompt milk flow after birth. Once breastfeeding has begun, thoughts of your baby, as well as his or her sucking or crying, can stimulate the release of milk   from your milk-producing glands. Benefits of breastfeeding Research shows that breastfeeding offers many health benefits for infants and mothers. It also offers a cost-free and convenient way to feed your baby. For your baby  Your first milk  (colostrum) helps your baby's digestive system to function better.  Special cells in your milk (antibodies) help your baby to fight off infections.  Breastfed babies are less likely to develop asthma, allergies, obesity, or type 2 diabetes. They are also at lower risk for sudden infant death syndrome (SIDS).  Nutrients in breast milk are better able to meet your baby's needs compared to infant formula.  Breast milk improves your baby's brain development. For you  Breastfeeding helps to create a very special bond between you and your baby.  Breastfeeding is convenient. Breast milk costs nothing and is always available at the correct temperature.  Breastfeeding helps to burn calories. It helps you to lose the weight that you gained during pregnancy.  Breastfeeding makes your uterus return faster to its size before pregnancy. It also slows bleeding (lochia) after you give birth.  Breastfeeding helps to lower your risk of developing type 2 diabetes, osteoporosis, rheumatoid arthritis, cardiovascular disease, and breast, ovarian, uterine, and endometrial cancer later in life. Breastfeeding basics Starting breastfeeding  Find a comfortable place to sit or lie down, with your neck and back well-supported.  Place a pillow or a rolled-up blanket under your baby to bring him or her to the level of your breast (if you are seated). Nursing pillows are specially designed to help support your arms and your baby while you breastfeed.  Make sure that your baby's tummy (abdomen) is facing your abdomen.  Gently massage your breast. With your fingertips, massage from the outer edges of your breast inward toward the nipple. This encourages milk flow. If your milk flows slowly, you may need to continue this action during the feeding.  Support your breast with 4 fingers underneath and your thumb above your nipple (make the letter "C" with your hand). Make sure your fingers are well away from your nipple and  your baby's mouth.  Stroke your baby's lips gently with your finger or nipple.  When your baby's mouth is open wide enough, quickly bring your baby to your breast, placing your entire nipple and as much of the areola as possible into your baby's mouth. The areola is the colored area around your nipple. ? More areola should be visible above your baby's upper lip than below the lower lip. ? Your baby's lips should be opened and extended outward (flanged) to ensure an adequate, comfortable latch. ? Your baby's tongue should be between his or her lower gum and your breast.  Make sure that your baby's mouth is correctly positioned around your nipple (latched). Your baby's lips should create a seal on your breast and be turned out (everted).  It is common for your baby to suck about 2-3 minutes in order to start the flow of breast milk. Latching Teaching your baby how to latch onto your breast properly is very important. An improper latch can cause nipple pain, decreased milk supply, and poor weight gain in your baby. Also, if your baby is not latched onto your nipple properly, he or she may swallow some air during feeding. This can make your baby fussy. Burping your baby when you switch breasts during the feeding can help to get rid of the air. However, teaching your baby to latch on properly is still the best way to   prevent fussiness from swallowing air while breastfeeding. Signs that your baby has successfully latched onto your nipple  Silent tugging or silent sucking, without causing you pain. Infant's lips should be extended outward (flanged).  Swallowing heard between every 3-4 sucks once your milk has started to flow (after your let-down milk reflex occurs).  Muscle movement above and in front of his or her ears while sucking. Signs that your baby has not successfully latched onto your nipple  Sucking sounds or smacking sounds from your baby while breastfeeding.  Nipple pain. If you think  your baby has not latched on correctly, slip your finger into the corner of your baby's mouth to break the suction and place it between your baby's gums. Attempt to start breastfeeding again. Signs of successful breastfeeding Signs from your baby  Your baby will gradually decrease the number of sucks or will completely stop sucking.  Your baby will fall asleep.  Your baby's body will relax.  Your baby will retain a small amount of milk in his or her mouth.  Your baby will let go of your breast by himself or herself. Signs from you  Breasts that have increased in firmness, weight, and size 1-3 hours after feeding.  Breasts that are softer immediately after breastfeeding.  Increased milk volume, as well as a change in milk consistency and color by the fifth day of breastfeeding.  Nipples that are not sore, cracked, or bleeding. Signs that your baby is getting enough milk  Wetting at least 1-2 diapers during the first 24 hours after birth.  Wetting at least 5-6 diapers every 24 hours for the first week after birth. The urine should be clear or pale yellow by the age of 5 days.  Wetting 6-8 diapers every 24 hours as your baby continues to grow and develop.  At least 3 stools in a 24-hour period by the age of 5 days. The stool should be soft and yellow.  At least 3 stools in a 24-hour period by the age of 7 days. The stool should be seedy and yellow.  No loss of weight greater than 10% of birth weight during the first 3 days of life.  Average weight gain of 4-7 oz (113-198 g) per week after the age of 4 days.  Consistent daily weight gain by the age of 5 days, without weight loss after the age of 2 weeks. After a feeding, your baby may spit up a small amount of milk. This is normal. Breastfeeding frequency and duration Frequent feeding will help you make more milk and can prevent sore nipples and extremely full breasts (breast engorgement). Breastfeed when you feel the need to  reduce the fullness of your breasts or when your baby shows signs of hunger. This is called "breastfeeding on demand." Signs that your baby is hungry include:  Increased alertness, activity, or restlessness.  Movement of the head from side to side.  Opening of the mouth when the corner of the mouth or cheek is stroked (rooting).  Increased sucking sounds, smacking lips, cooing, sighing, or squeaking.  Hand-to-mouth movements and sucking on fingers or hands.  Fussing or crying. Avoid introducing a pacifier to your baby in the first 4-6 weeks after your baby is born. After this time, you may choose to use a pacifier. Research has shown that pacifier use during the first year of a baby's life decreases the risk of sudden infant death syndrome (SIDS). Allow your baby to feed on each breast as long as   he or she wants. When your baby unlatches or falls asleep while feeding from the first breast, offer the second breast. Because newborns are often sleepy in the first few weeks of life, you may need to awaken your baby to get him or her to feed. Breastfeeding times will vary from baby to baby. However, the following rules can serve as a guide to help you make sure that your baby is properly fed:  Newborns (babies 4 weeks of age or younger) may breastfeed every 1-3 hours.  Newborns should not go without breastfeeding for longer than 3 hours during the day or 5 hours during the night.  You should breastfeed your baby a minimum of 8 times in a 24-hour period. Breast milk pumping Pumping and storing breast milk allows you to make sure that your baby is exclusively fed your breast milk, even at times when you are unable to breastfeed. This is especially important if you go back to work while you are still breastfeeding, or if you are not able to be present during feedings. Your lactation consultant can help you find a method of pumping that works best for you and give you guidelines about how long it is  safe to store breast milk.      Caring for your breasts while you breastfeed Nipples can become dry, cracked, and sore while breastfeeding. The following recommendations can help keep your breasts moisturized and healthy:  Avoid using soap on your nipples.  Wear a supportive bra designed especially for nursing. Avoid wearing underwire-style bras or extremely tight bras (sports bras).  Air-dry your nipples for 3-4 minutes after each feeding.  Use only cotton bra pads to absorb leaked breast milk. Leaking of breast milk between feedings is normal.  Use lanolin on your nipples after breastfeeding. Lanolin helps to maintain your skin's normal moisture barrier. Pure lanolin is not harmful (not toxic) to your baby. You may also hand express a few drops of breast milk and gently massage that milk into your nipples and allow the milk to air-dry. In the first few weeks after giving birth, some women experience breast engorgement. Engorgement can make your breasts feel heavy, warm, and tender to the touch. Engorgement peaks within 3-5 days after you give birth. The following recommendations can help to ease engorgement:  Completely empty your breasts while breastfeeding or pumping. You may want to start by applying warm, moist heat (in the shower or with warm, water-soaked hand towels) just before feeding or pumping. This increases circulation and helps the milk flow. If your baby does not completely empty your breasts while breastfeeding, pump any extra milk after he or she is finished.  Apply ice packs to your breasts immediately after breastfeeding or pumping, unless this is too uncomfortable for you. To do this: ? Put ice in a plastic bag. ? Place a towel between your skin and the bag. ? Leave the ice on for 20 minutes, 2-3 times a day.  Make sure that your baby is latched on and positioned properly while breastfeeding. If engorgement persists after 48 hours of following these recommendations,  contact your health care provider or a lactation consultant. Overall health care recommendations while breastfeeding  Eat 3 healthy meals and 3 snacks every day. Well-nourished mothers who are breastfeeding need an additional 450-500 calories a day. You can meet this requirement by increasing the amount of a balanced diet that you eat.  Drink enough water to keep your urine pale yellow or clear.  Rest   often, relax, and continue to take your prenatal vitamins to prevent fatigue, stress, and low vitamin and mineral levels in your body (nutrient deficiencies).  Do not use any products that contain nicotine or tobacco, such as cigarettes and e-cigarettes. Your baby may be harmed by chemicals from cigarettes that pass into breast milk and exposure to secondhand smoke. If you need help quitting, ask your health care provider.  Avoid alcohol.  Do not use illegal drugs or marijuana.  Talk with your health care provider before taking any medicines. These include over-the-counter and prescription medicines as well as vitamins and herbal supplements. Some medicines that may be harmful to your baby can pass through breast milk.  It is possible to become pregnant while breastfeeding. If birth control is desired, ask your health care provider about options that will be safe while breastfeeding your baby. Where to find more information: [employer]: www.llli.org Contact a health care provider if:  You feel like you want to stop breastfeeding or have become frustrated with breastfeeding.  Your nipples are cracked or bleeding.  Your breasts are red, tender, or warm.  You have: ? Painful breasts or nipples. ? A swollen area on either breast. ? A fever or chills. ? Nausea or vomiting. ? Drainage other than breast milk from your nipples.  Your breasts do not become full before feedings by the fifth day after you give birth.  You feel sad and depressed.  Your baby is: ? Too  sleepy to eat well. ? Having trouble sleeping. ? More than 1 week old and wetting fewer than 6 diapers in a 24-hour period. ? Not gaining weight by 5 days of age.  Your baby has fewer than 3 stools in a 24-hour period.  Your baby's skin or the white parts of his or her eyes become yellow. Get help right away if:  Your baby is overly tired (lethargic) and does not want to wake up and feed.  Your baby develops an unexplained fever. Summary  Breastfeeding offers many health benefits for infant and mothers.  Try to breastfeed your infant when he or she shows early signs of hunger.  Gently tickle or stroke your baby's lips with your finger or nipple to allow the baby to open his or her mouth. Bring the baby to your breast. Make sure that much of the areola is in your baby's mouth. Offer one side and burp the baby before you offer the other side.  Talk with your health care provider or lactation consultant if you have questions or you face problems as you breastfeed. This information is not intended to replace advice given to you by your health care provider. Make sure you discuss any questions you have with your health care provider. Document Revised: 06/06/2017 Document Reviewed: 04/13/2016 Elsevier Patient Education  2021 Elsevier Inc.  

## 2020-06-27 NOTE — Progress Notes (Signed)
Interpreter # [phone] used for today's intake.

## 2020-06-27 NOTE — Progress Notes (Signed)
Pt is having lower back and pelvic pain. Pt states pain is near her appendectomy site, pt states surgery was in 2016.

## 2020-06-27 NOTE — Progress Notes (Signed)
   Subjective:    Doris Stephens is a G1P0 [redacted]w[redacted]d being seen today for her first obstetrical visit.  Her obstetrical history is significant for first pregnancy. Patient does intend to breast feed. Pregnancy history fully reviewed. 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  There were no vitals filed for this visit.  HISTORY: OB History  Gravida Para Term Preterm AB Living  1            SAB IAB Ectopic Multiple Live Births               # Outcome Date GA Lbr Len/2nd Weight Sex Delivery Anes PTL Lv  1 Current            Past Medical History:  Diagnosis Date  . Medical history non-contributory    Past Surgical History:  Procedure Laterality Date  . APPENDECTOMY     Family History  Problem Relation Age of Onset  . Healthy Mother   . Healthy Father      Exam    Uterus:     Pelvic Exam:    Perineum: No Hemorrhoids, Normal Perineum   Vulva: normal   Vagina:  normal mucosa, normal discharge   pH:    Cervix: nulliparous appearance and cervix is closed and long   Adnexa: normal adnexa   Bony Pelvis: gynecoid  System: Breast:  normal appearance, no masses or tenderness   Skin: normal coloration and turgor, no rashes    Neurologic: oriented, no focal deficits   Extremities: normal strength, tone, and muscle mass   HEENT extra ocular movement intact   Mouth/Teeth mucous membranes moist, pharynx normal without lesions and dental hygiene good   Neck supple and no masses   Cardiovascular: regular rate and rhythm   Respiratory:  appears well, vitals normal, no respiratory distress, acyanotic, normal RR, chest clear, no wheezing, crepitations, rhonchi, normal symmetric air entry   Abdomen: soft, non-tender; bowel sounds normal; no masses,  no organomegaly   Urinary:       Assessment:    Pregnancy: G1P0 Patient Active Problem List   Diagnosis Date Noted  . Supervision of normal first teen pregnancy 06/24/2020         Plan:     Initial labs drawn. Prenatal vitamins. Problem list reviewed and updated. Genetic Screening discussed : panorama ordered.  Ultrasound discussed; fetal survey: ordered.  Follow up in 4 weeks. 50% of 30 min visit spent on counseling and coordination of care.  Patient spoken to in [place] 06/27/2020

## 2020-06-28 LAB — CERVICOVAGINAL ANCILLARY ONLY
Chlamydia: NEGATIVE
Comment: NEGATIVE
Comment: NORMAL
Neisseria Gonorrhea: NEGATIVE

## 2020-06-29 LAB — CBC/D/PLT+RPR+RH+ABO+RUB AB...
Antibody Screen: NEGATIVE
Basophils Absolute: 0 10*3/uL (ref 0.0–0.2)
Basos: 0 %
EOS (ABSOLUTE): 0.1 10*3/uL (ref 0.0–0.4)
Eos: 1 %
HCV Ab: <0.1 s/co ratio (ref 0.0–0.9)
HIV Screen 4th Generation wRfx: NONREACTIVE
Hematocrit: 35 % (ref 34.0–46.6)
Hemoglobin: 11.6 g/dL (ref 11.1–15.9)
Hepatitis B Surface Ag: NEGATIVE
Immature Grans (Abs): 0 10*3/uL (ref 0.0–0.1)
Immature Granulocytes: 0 %
Lymphocytes Absolute: 3 10*3/uL (ref 0.7–3.1)
Lymphs: 39 %
MCH: 30 pg (ref 26.6–33.0)
MCHC: 33.1 g/dL (ref 31.5–35.7)
MCV: 90 fL (ref 79–97)
Monocytes Absolute: 0.7 10*3/uL (ref 0.1–0.9)
Monocytes: 9 %
Neutrophils Absolute: 4 10*3/uL (ref 1.4–7.0)
Neutrophils: 51 %
Platelets: 348 10*3/uL (ref 150–450)
RBC: 3.87 x10E6/uL (ref 3.77–5.28)
RDW: 14.1 % (ref 11.7–15.4)
RPR Ser Ql: NONREACTIVE
Rh Factor: POSITIVE
Rubella Antibodies, IGG: >33 index (ref >0.99)
WBC: 7.8 10*3/uL (ref 3.4–10.8)

## 2020-06-29 LAB — HCV INTERPRETATION

## 2020-06-29 LAB — URINE CULTURE, OB REFLEX

## 2020-06-29 LAB — CULTURE, OB URINE

## 2020-07-04 ENCOUNTER — Encounter: Payer: Self-pay | Admitting: Obstetrics and Gynecology

## 2020-07-11 ENCOUNTER — Encounter: Payer: Self-pay | Admitting: Obstetrics and Gynecology

## 2020-07-25 ENCOUNTER — Ambulatory Visit (INDEPENDENT_AMBULATORY_CARE_PROVIDER_SITE_OTHER): Payer: Medicaid Other | Admitting: Obstetrics and Gynecology

## 2020-07-25 ENCOUNTER — Encounter: Payer: Self-pay | Admitting: Obstetrics and Gynecology

## 2020-07-25 ENCOUNTER — Other Ambulatory Visit: Payer: Self-pay

## 2020-07-25 VITALS — BP 123/80 | HR 102 | Wt 154.6 lb

## 2020-07-25 DIAGNOSIS — Z3400 Encounter for supervision of normal first pregnancy, unspecified trimester: Secondary | ICD-10-CM

## 2020-07-25 DIAGNOSIS — Z3A16 16 weeks gestation of pregnancy: Secondary | ICD-10-CM

## 2020-07-25 DIAGNOSIS — M549 Dorsalgia, unspecified: Secondary | ICD-10-CM

## 2020-07-25 DIAGNOSIS — O99891 Other specified diseases and conditions complicating pregnancy: Secondary | ICD-10-CM

## 2020-07-25 MED ORDER — CYCLOBENZAPRINE HCL 10 MG PO TABS: 10.0000 mg | ORAL_TABLET | Freq: Three times a day (TID) | ORAL | 2 refills | Status: AC | PRN

## 2020-07-25 NOTE — Progress Notes (Signed)
Pt presents for ROB and AFP  Used [place] translator [phone] Pt c/o low back pain causing walking to be difficult.

## 2020-07-25 NOTE — Progress Notes (Signed)
   PRENATAL VISIT NOTE  Subjective:  Doris Stephens is a 20 y.o. G1P0 at [redacted]w[redacted]d being seen today for ongoing prenatal care.  She is currently monitored for the following issues for this low-risk pregnancy and has Supervision of normal first teen pregnancy on their problem list.  Patient reports backache.  Contractions: Not present. Vag. Bleeding: None.  Movement: Absent. Denies leaking of fluid.   The following portions of the patient's history were reviewed and updated as appropriate: allergies, current medications, past family history, past medical history, past social history, past surgical history and problem list.   Objective:   Vitals:   07/25/20 1521  BP: 123/80  Pulse: (!) 102  Weight: 154 lb 9.6 oz (70.1 kg)    Fetal Status: Fetal Heart Rate (bpm): 154   Movement: Absent     General:  Alert, oriented and cooperative. Patient is in no acute distress.  Skin: Skin is warm and dry. No rash noted.   Cardiovascular: Normal heart rate noted  Respiratory: Normal respiratory effort, no problems with respiration noted  Abdomen: Soft, gravid, appropriate for gestational age.  Pain/Pressure: Present     Pelvic: Cervical exam deferred        Extremities: Normal range of motion.  Edema: None  Mental Status: Normal mood and affect. Normal behavior. Normal judgment and thought content.   Assessment and Plan:  Pregnancy: G1P0 at [redacted]w[redacted]d 1. [redacted] weeks gestation of pregnancy   2. Encounter for supervision of normal pregnancy in teen primigravida, antepartum AFP today Advised patient to stretch and exercise Anatomy ultrasound scheduled end of May Rx Flexeril and referral to PT made  Preterm labor symptoms and general obstetric precautions including but not limited to vaginal bleeding, contractions, leaking of fluid and fetal movement were reviewed in detail with the patient. Please refer to After Visit Summary for other counseling recommendations.   Return in about 4 weeks (around 08/22/2020)  for in person, ROB, Low risk.  Future Appointments  Date Time Provider Department Center  07/25/2020  4:15 PM Ketara Cavness, Gigi Gin, MD [facility] None  08/15/2020  2:15 PM [facility]2 [facility]    Catalina Antigua, MD

## 2020-07-27 LAB — AFP, SERUM, OPEN SPINA BIFIDA
AFP MoM: 0.87
AFP Value: 30.2 ng/mL
Gest. Age on Collection Date: 16.1 weeks
Maternal Age At EDD: 20.1 yr
OSBR Risk 1 IN: 10000
Test Results:: NEGATIVE
Weight: 154 [lb_av]

## 2020-08-15 ENCOUNTER — Other Ambulatory Visit: Payer: Self-pay

## 2020-08-15 ENCOUNTER — Other Ambulatory Visit: Payer: Self-pay | Admitting: Obstetrics and Gynecology

## 2020-08-15 ENCOUNTER — Ambulatory Visit: Payer: Medicaid Other | Attending: Obstetrics and Gynecology

## 2020-08-15 DIAGNOSIS — Z3400 Encounter for supervision of normal first pregnancy, unspecified trimester: Secondary | ICD-10-CM | POA: Diagnosis present

## 2020-08-15 DIAGNOSIS — O285 Abnormal chromosomal and genetic finding on antenatal screening of mother: Secondary | ICD-10-CM | POA: Diagnosis present

## 2020-08-16 ENCOUNTER — Other Ambulatory Visit: Payer: Self-pay | Admitting: Obstetrics

## 2020-08-16 DIAGNOSIS — O26842 Uterine size-date discrepancy, second trimester: Secondary | ICD-10-CM

## 2020-08-23 ENCOUNTER — Encounter: Payer: Medicaid Other | Admitting: Obstetrics & Gynecology

## 2020-08-23 ENCOUNTER — Ambulatory Visit: Payer: Medicaid Other

## 2020-08-24 ENCOUNTER — Ambulatory Visit: Payer: Medicaid Other | Attending: Obstetrics

## 2020-08-25 ENCOUNTER — Ambulatory Visit (INDEPENDENT_AMBULATORY_CARE_PROVIDER_SITE_OTHER): Payer: Medicaid Other | Admitting: Obstetrics & Gynecology

## 2020-08-25 ENCOUNTER — Other Ambulatory Visit (HOSPITAL_COMMUNITY)
Admission: RE | Admit: 2020-08-25 | Discharge: 2020-08-25 | Disposition: A | Discharge status: Home / Self Care | Payer: Medicaid Other | Source: Ambulatory Visit | Attending: Obstetrics & Gynecology | Admitting: Obstetrics & Gynecology

## 2020-08-25 ENCOUNTER — Encounter: Payer: Self-pay | Admitting: Obstetrics & Gynecology

## 2020-08-25 ENCOUNTER — Other Ambulatory Visit: Payer: Self-pay

## 2020-08-25 VITALS — BP 136/84 | HR 103 | Wt 164.0 lb

## 2020-08-25 DIAGNOSIS — N898 Other specified noninflammatory disorders of vagina: Secondary | ICD-10-CM | POA: Diagnosis present

## 2020-08-25 DIAGNOSIS — Z3400 Encounter for supervision of normal first pregnancy, unspecified trimester: Secondary | ICD-10-CM

## 2020-08-25 MED ORDER — DOCUSATE SODIUM 100 MG PO CAPS: 100.0000 mg | ORAL_CAPSULE | Freq: Two times a day (BID) | ORAL | 2 refills | Status: AC | PRN

## 2020-08-25 NOTE — Progress Notes (Signed)
   PRENATAL VISIT NOTE  Subjective:  Doris Stephens is a 20 y.o. G1P0 at [redacted]w[redacted]d being seen today for ongoing prenatal care.  She is currently monitored for the following issues for this low-risk pregnancy and has Supervision of normal first teen pregnancy on their problem list.  Patient reports no complaints.  Contractions: Not present. Vag. Bleeding: None.  Movement: Present. Denies leaking of fluid.   The following portions of the patient's history were reviewed and updated as appropriate: allergies, current medications, past family history, past medical history, past social history, past surgical history and problem list.   Objective:   Vitals:   08/25/20 1503  BP: 136/84  Pulse: (!) 103  Weight: 164 lb (74.4 kg)    Fetal Status: Fetal Heart Rate (bpm): 158   Movement: Present     General:  Alert, oriented and cooperative. Patient is in no acute distress.  Skin: Skin is warm and dry. No rash noted.   Cardiovascular: Normal heart rate noted  Respiratory: Normal respiratory effort, no problems with respiration noted  Abdomen: Soft, gravid, appropriate for gestational age.  Pain/Pressure: Absent     Pelvic: Cervical exam deferred        Extremities: Normal range of motion.  Edema: None  Mental Status: Normal mood and affect. Normal behavior. Normal judgment and thought content.   Assessment and Plan:  Pregnancy: G1P0 at [redacted]w[redacted]d 1. Vaginal discharge Self swab for vaginosis - Cervicovaginal ancillary only( [place])  2. Encounter for supervision of normal pregnancy in teen primigravida, antepartum Mild constipation - docusate sodium (COLACE) 100 MG capsule; Take 1 capsule (100 mg total) by mouth 2 (two) times daily as needed for mild constipation.  Dispense: 30 capsule; Refill: 2  Preterm labor symptoms and general obstetric precautions including but not limited to vaginal bleeding, contractions, leaking of fluid and fetal movement were reviewed in detail with the patient. Please  refer to After Visit Summary for other counseling recommendations.  She will travel to [place] 6/20 Return in about 2 weeks (around 09/08/2020), or if symptoms worsen or fail to improve.  Future Appointments  Date Time Provider Department Center  09/14/2020 10:30 AM [facility] NURSE [facility]  09/14/2020 10:45 AM [facility] US4 [facility]  09/22/2020 11:15 AM Conan Bowens, MD [facility] None  10/10/2020  2:45 PM Desenglau, Shireen Quan, PT [facility]    Scheryl Darter, MD

## 2020-08-25 NOTE — Patient Instructions (Signed)

## 2020-08-25 NOTE — Progress Notes (Signed)
ROB [redacted]w[redacted]d  CC: Constipation requesting Rx and vaginal discharge and odor. Pt wants to do self swab.

## 2020-08-26 LAB — CERVICOVAGINAL ANCILLARY ONLY
Bacterial Vaginitis (gardnerella): POSITIVE — AB
Candida Glabrata: NEGATIVE
Candida Vaginitis: POSITIVE — AB
Chlamydia: NEGATIVE
Comment: NEGATIVE
Comment: NEGATIVE
Comment: NEGATIVE
Comment: NEGATIVE
Comment: NEGATIVE
Comment: NORMAL
Neisseria Gonorrhea: NEGATIVE
Trichomonas: NEGATIVE

## 2020-08-29 ENCOUNTER — Other Ambulatory Visit: Payer: Self-pay | Admitting: Obstetrics & Gynecology

## 2020-08-29 DIAGNOSIS — N898 Other specified noninflammatory disorders of vagina: Secondary | ICD-10-CM

## 2020-08-29 MED ORDER — FLUCONAZOLE 150 MG PO TABS: 150.0000 mg | ORAL_TABLET | Freq: Once | ORAL | 0 refills | Status: AC

## 2020-08-29 MED ORDER — METRONIDAZOLE 500 MG PO TABS: 500.0000 mg | ORAL_TABLET | Freq: Two times a day (BID) | ORAL | 0 refills | Status: AC

## 2020-09-14 ENCOUNTER — Ambulatory Visit: Payer: Medicaid Other | Attending: Obstetrics

## 2020-09-14 ENCOUNTER — Ambulatory Visit: Payer: Medicaid Other

## 2020-09-22 ENCOUNTER — Encounter: Payer: Medicaid Other | Admitting: Obstetrics and Gynecology

## 2020-10-10 ENCOUNTER — Ambulatory Visit: Payer: Medicaid Other | Attending: Obstetrics and Gynecology | Admitting: Physical Therapy

## 2022-04-01 IMAGING — US US OB < 14 WEEKS - US OB TV
1 series · 15 of 28 positions shown · non-contrast
Comparison: None.

CLINICAL DATA: Pain

EXAM:
OBSTETRIC <14 WK US AND TRANSVAGINAL OB US
TECHNIQUE: Both transabdominal and transvaginal ultrasound examinations were
performed for complete evaluation of the gestation as well as the
maternal uterus, adnexal regions, and pelvic cul-de-sac.
Transvaginal technique was performed to assess early pregnancy.

[Series 1: us ob < 14 weeks - us ob tv · 15 of 64 slices shown]
[im 1/64]
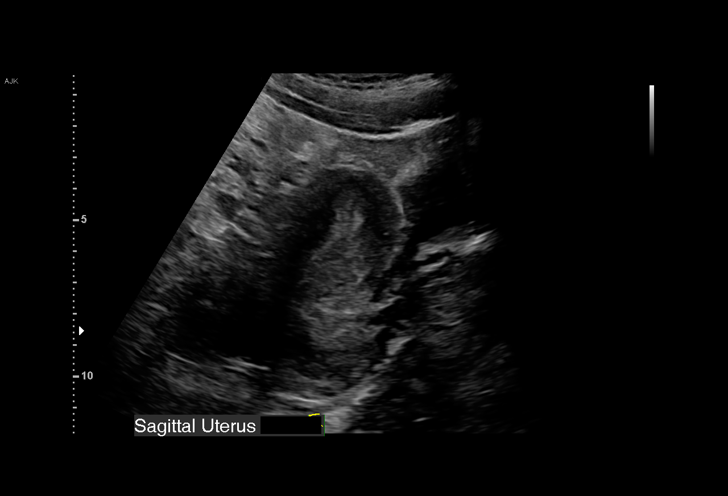
[im 5/64]
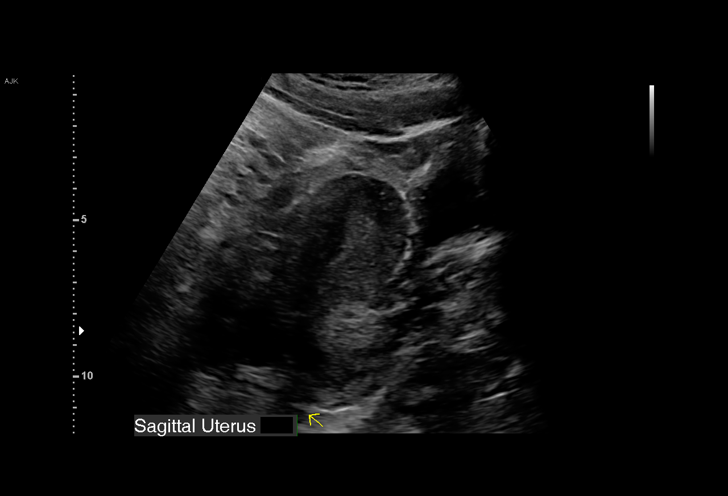
[im 10/64]
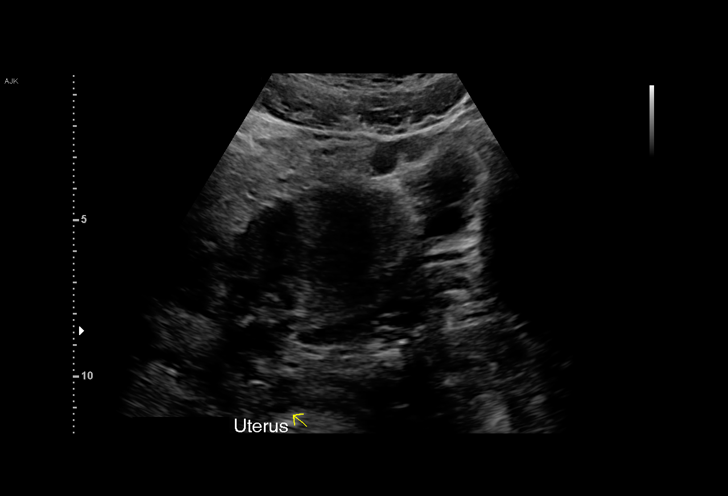
[im 15/64]
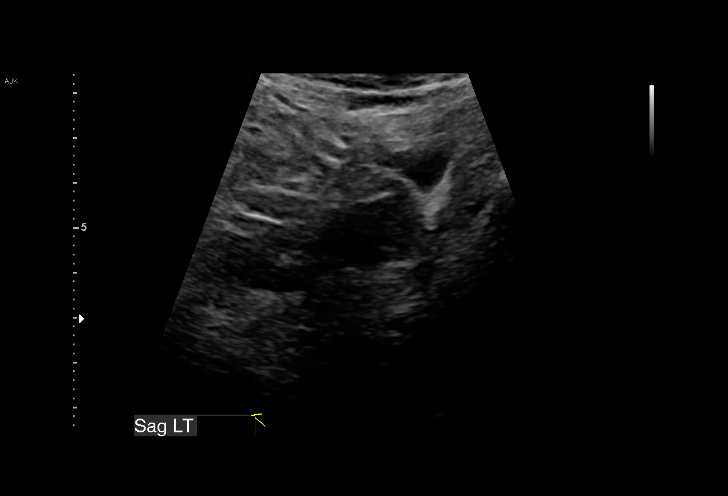
[im 19/64]
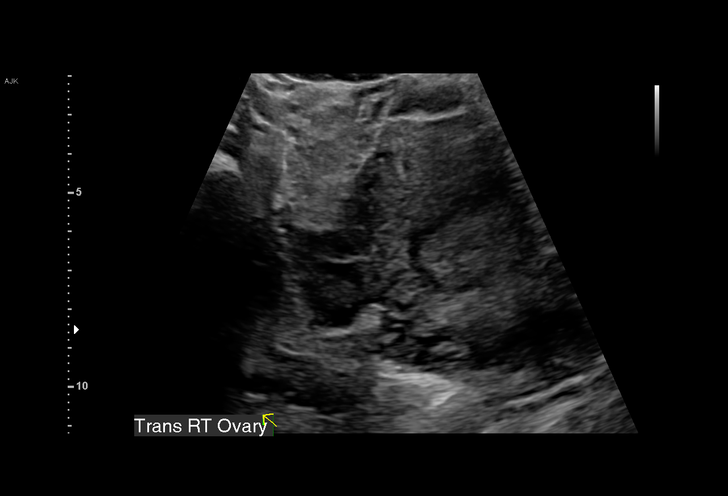
[im 24/64]
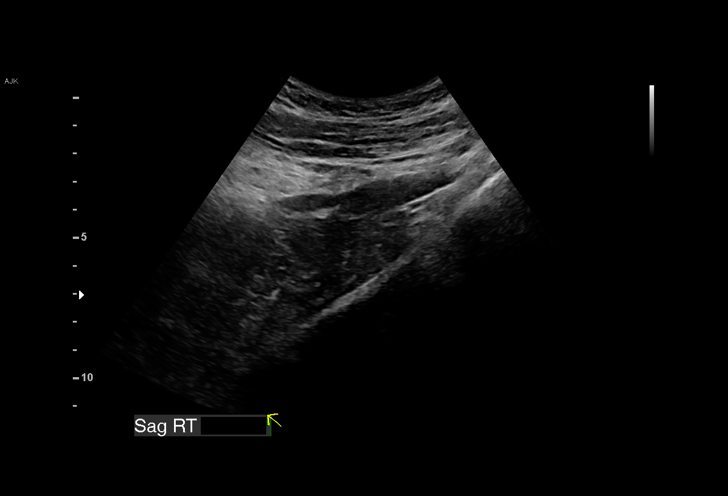
[im 29/64]
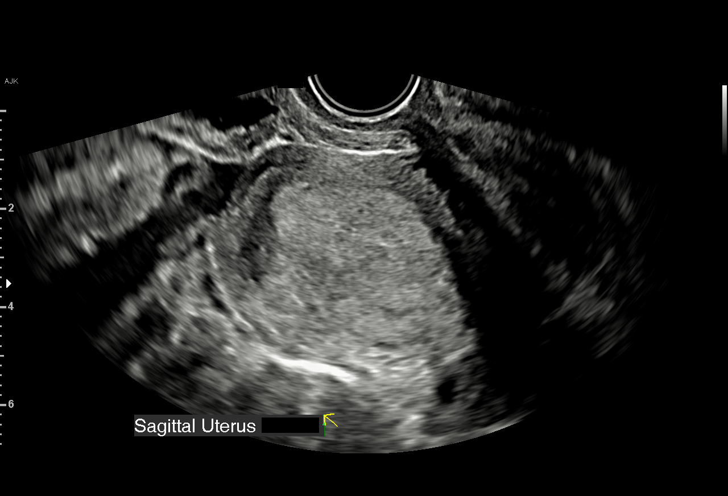
[im 33/64]
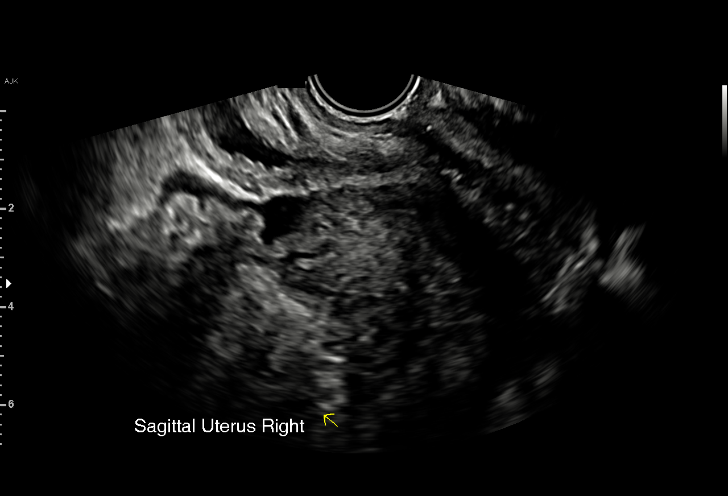
[im 36/64]
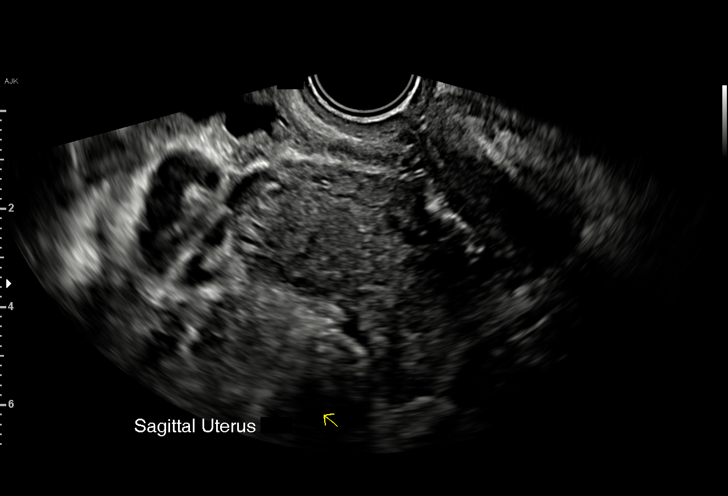
[im 40/64]
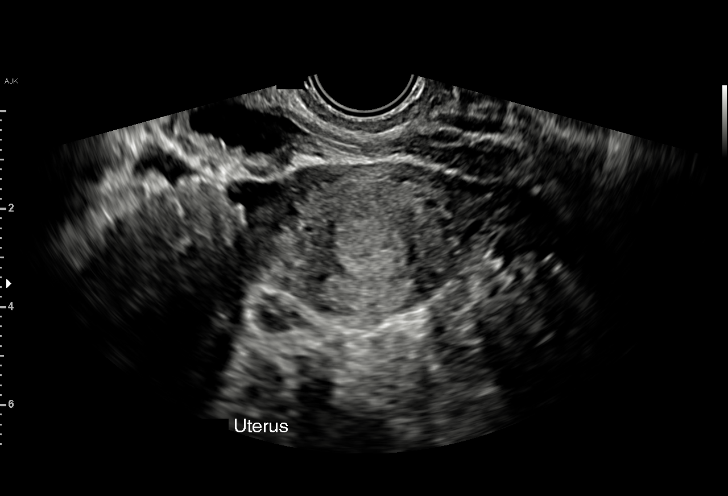
[im 45/64]
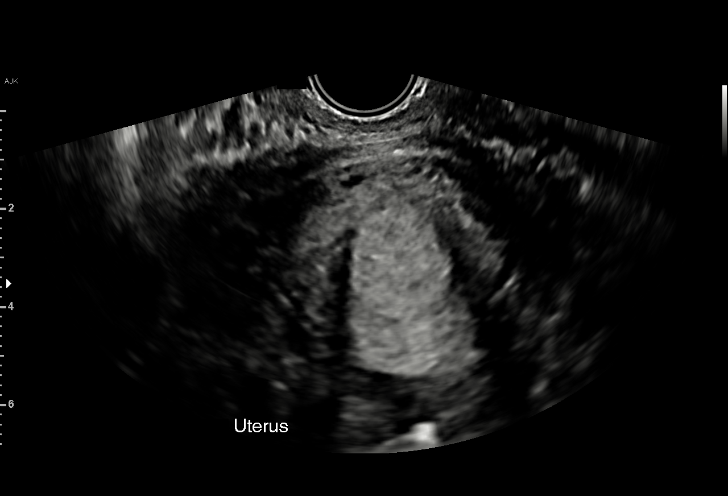
[im 50/64]
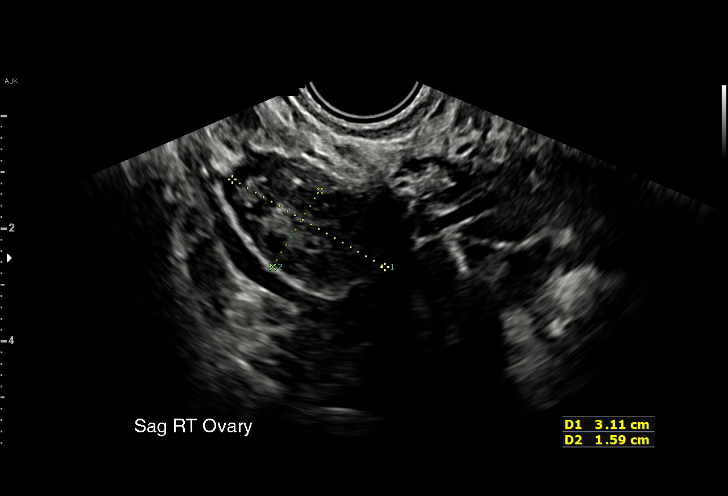
[im 54/64]
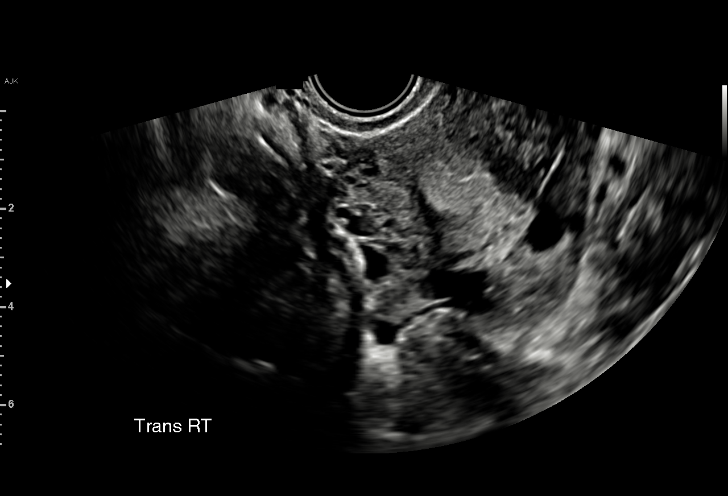
[im 59/64]
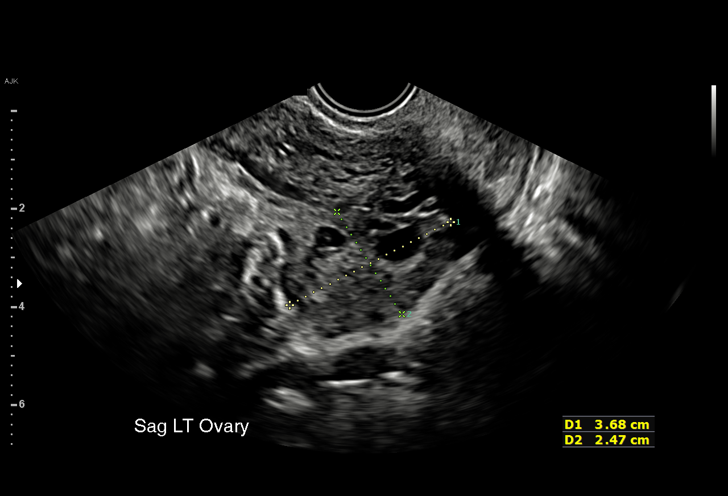
[im 64/64]
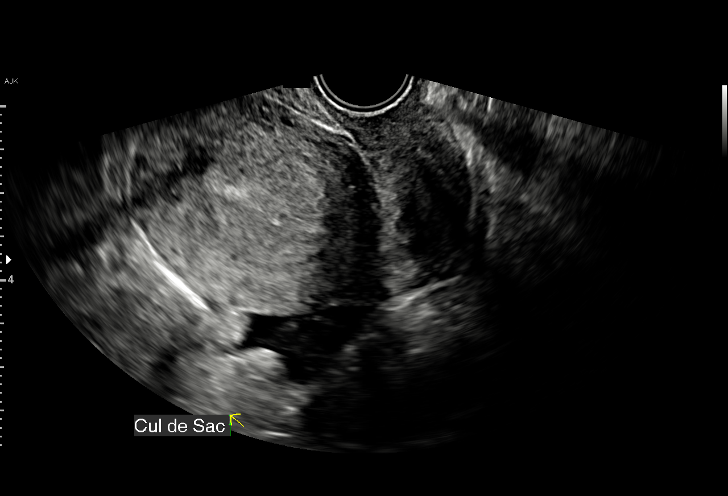

[15 of 28 positions shown; findings below may reference images not displayed]

FINDINGS: Intrauterine gestational sac: None

Yolk sac:  Not Visualized.

Embryo:  Not Visualized.

Cardiac Activity: Not Visualized.

Maternal uterus/adnexae: Anteverted uterus. Thickened endometrium up
to 24 mm in double stripe thickness could reflect decidualization.
No visible gestational sac or fluid collection is seen. Corpus
luteum noted in the left ovary. Ovaries are otherwise unremarkable.
Trace volume of anechoic free fluid in pelvis is nonspecific.
IMPRESSION: No intrauterine pregnancy visualized. Corpus luteum in the left
ovary and decidualization of the endometrium is seen. Differential
considerations would include early intrauterine pregnancy too early
to visualize, spontaneous abortion, or occult ectopic pregnancy.
Recommend close clinical followup and serial quantitative beta HCGs
and ultrasounds.

## 2022-05-15 IMAGING — US US OB COMP LESS 14 WK
1 series · 15 of 28 positions shown · non-contrast
Comparison: 05/05/2020

CLINICAL DATA: Back pain

EXAM:
OBSTETRIC <14 WK ULTRASOUND
TECHNIQUE: Transabdominal ultrasound was performed for evaluation of the
gestation as well as the maternal uterus and adnexal regions.

[Series 1: us ob comp less 14 wk · 15 of 36 slices shown]
[im 1/36]
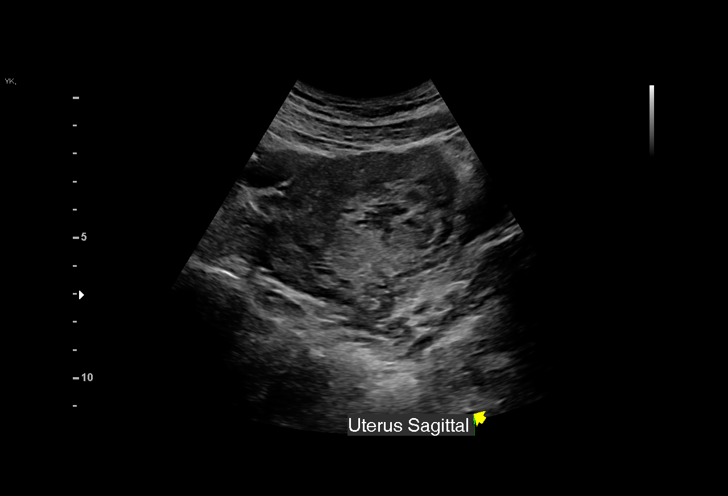
[im 3/36]
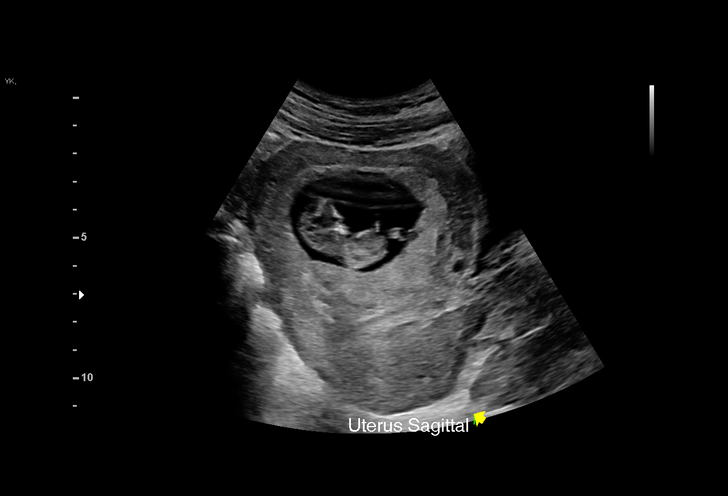
[im 6/36]
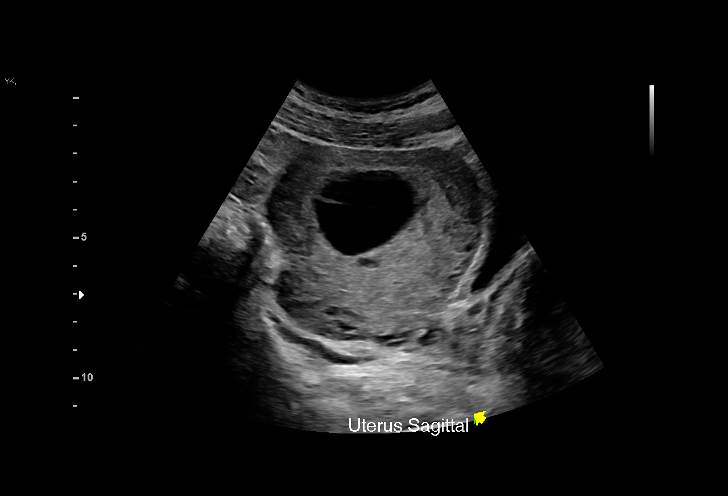
[im 8/36]
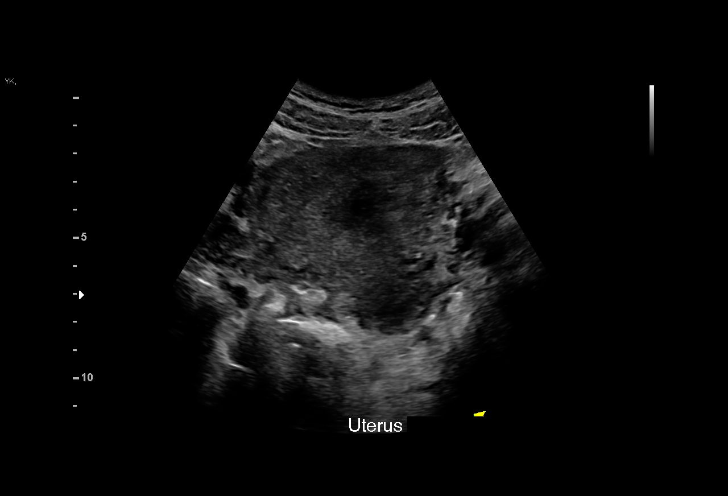
[im 11/36]
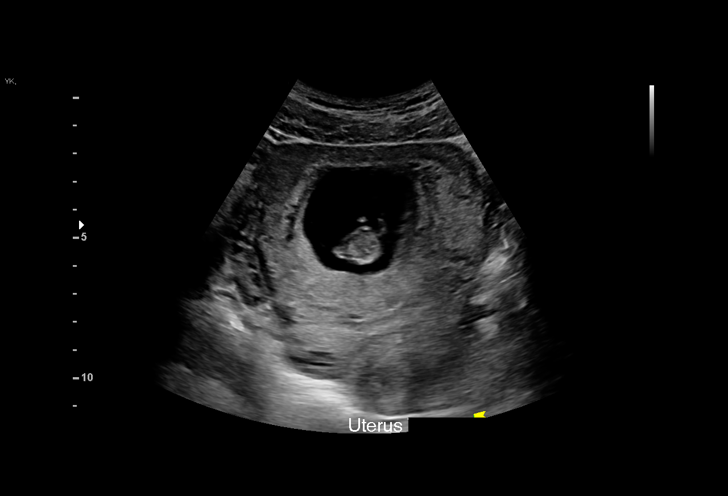
[im 13/36]
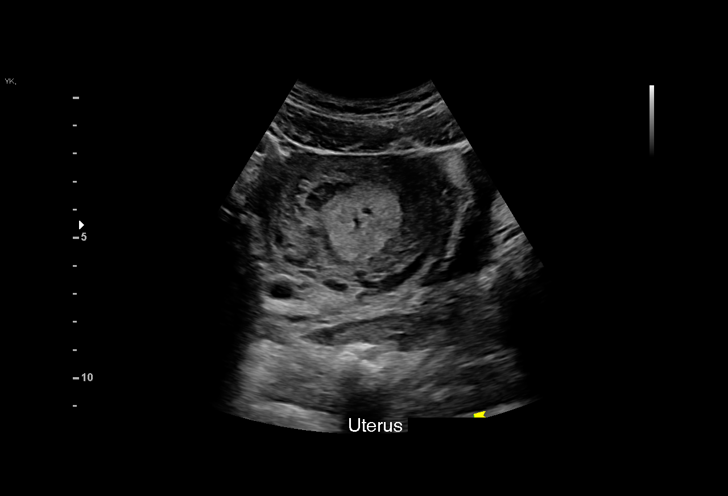
[im 16/36]
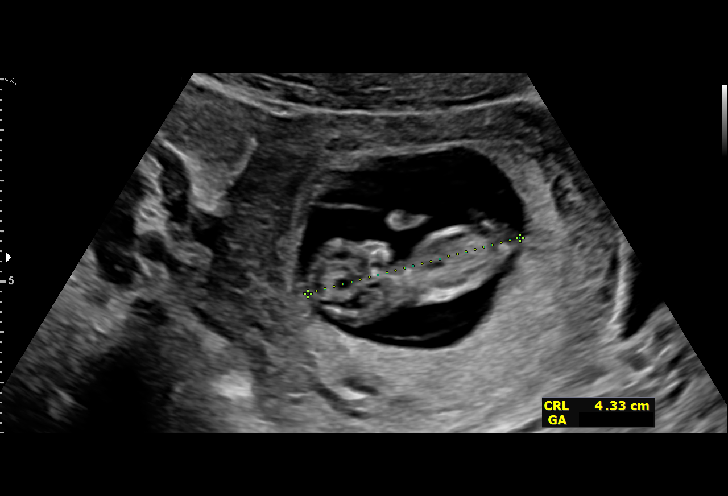
[im 19/36]
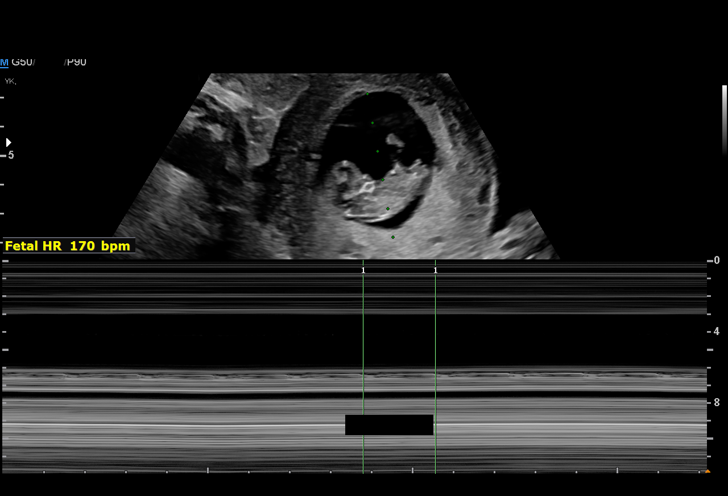
[im 20/36]
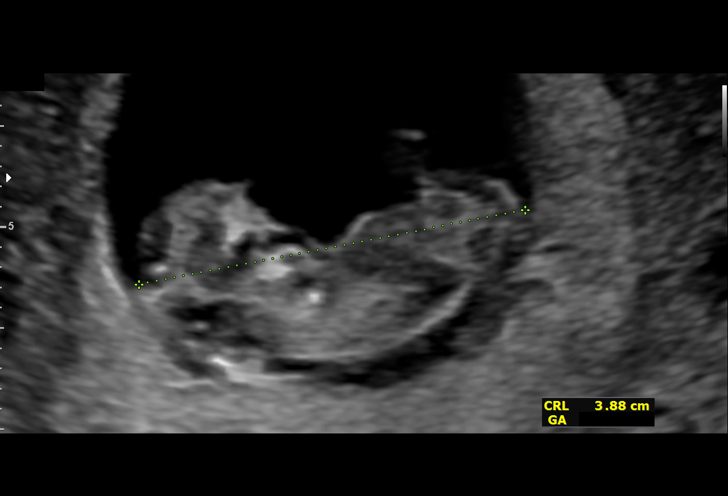
[im 23/36]
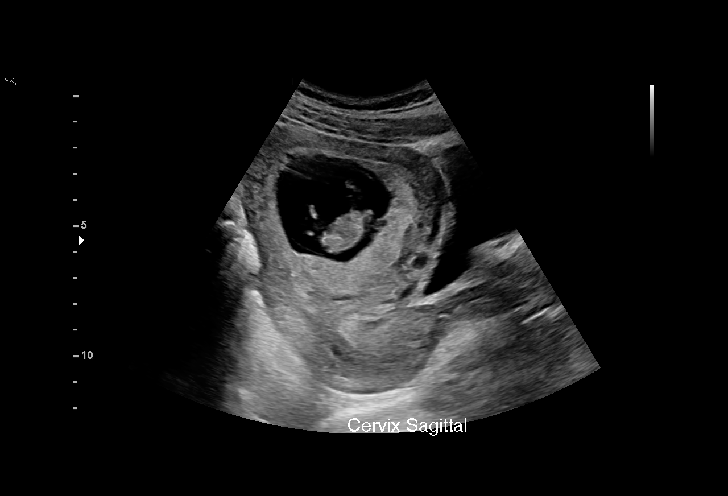
[im 25/36]
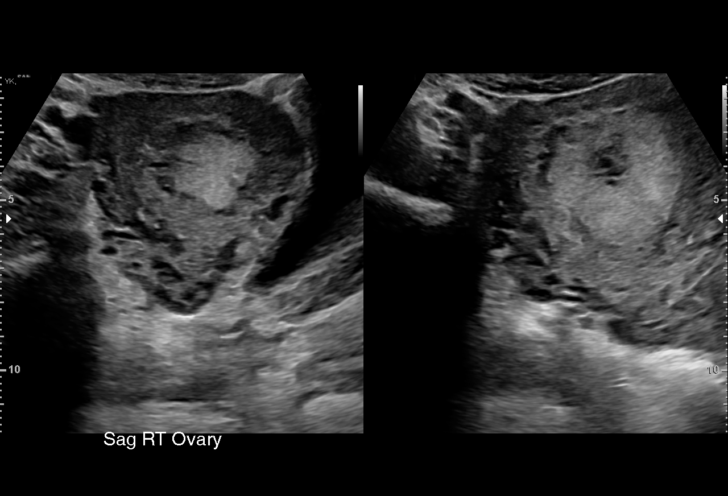
[im 28/36]
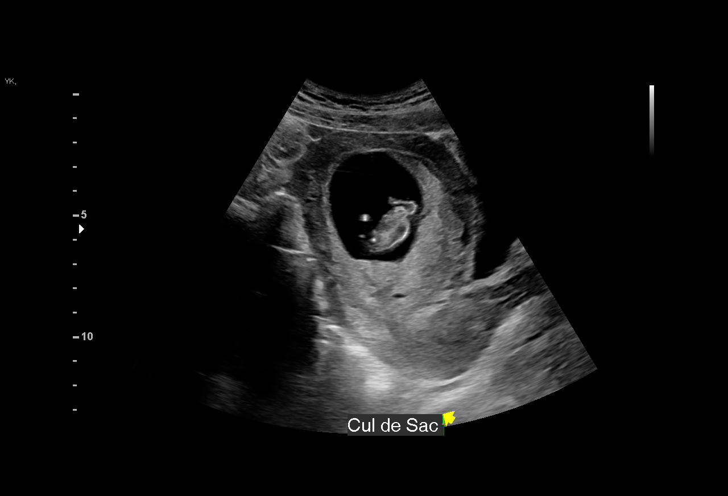
[im 30/36]
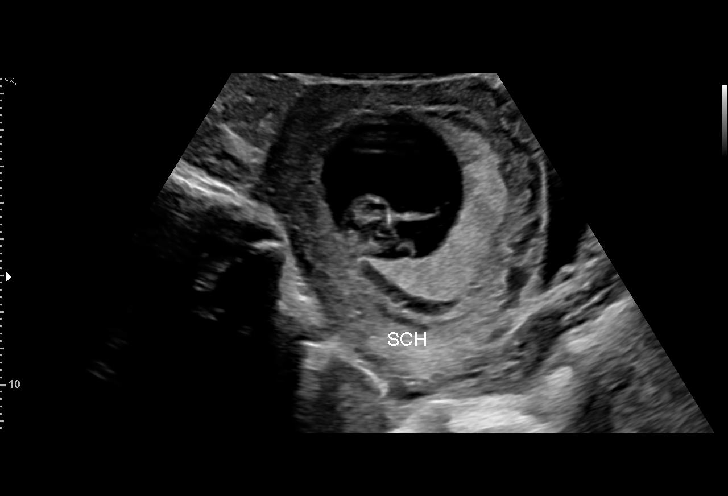
[im 33/36]
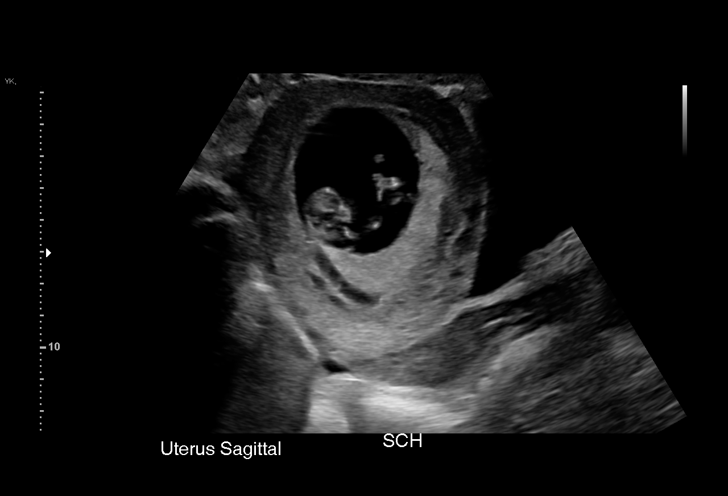
[im 36/36]
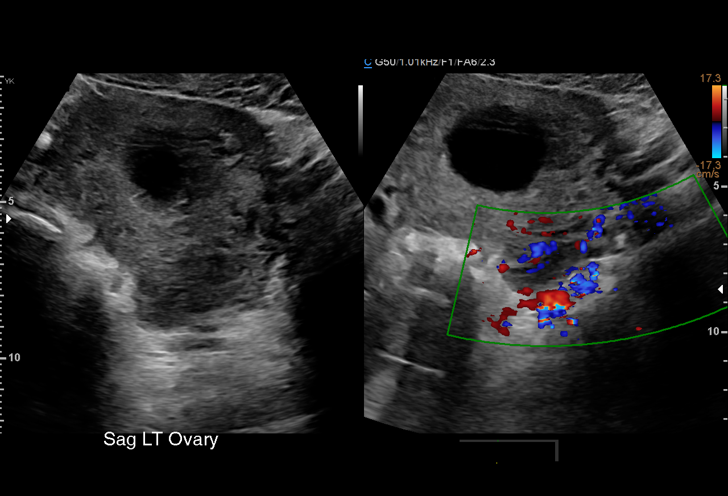

[15 of 28 positions shown; findings below may reference images not displayed]

FINDINGS: Intrauterine gestational sac: Single

Yolk sac:  Visualized.

Embryo:  Visualized.

Cardiac Activity: Visualized.

Heart Rate: 164 bpm

CRL: 39.8 mm   10 w 6 d                  US EDC: 01/08/2021

Subchorionic hemorrhage: Small subchorionic hemorrhage measuring up
to 5 mm in thickness.

Maternal uterus/adnexae: There are no adnexal masses. Left ovary
measures 4.6 x 2.1 x 2.8 cm in the right ovary measures 3.3 x 1.4 x
1.1 cm. No free fluid.
IMPRESSION: 1. Single live intrauterine pregnancy as above estimated age 10
weeks and 6 days.
2. Small subchorionic hemorrhage.
